# Patient Record
Sex: Female | Born: 1935 | Race: White | Hispanic: No | Marital: Married | Smoking: Never smoker
Health system: Southern US, Community
[De-identification: ages and names within clinical notes are randomized; demographics above are authoritative.]

## PROBLEM LIST (undated history)

## (undated) DIAGNOSIS — J449 Chronic obstructive pulmonary disease, unspecified: Secondary | ICD-10-CM

## (undated) DIAGNOSIS — S7222XA Displaced subtrochanteric fracture of left femur, initial encounter for closed fracture: Secondary | ICD-10-CM

## (undated) DIAGNOSIS — I099 Rheumatic heart disease, unspecified: Secondary | ICD-10-CM

## (undated) DIAGNOSIS — E119 Type 2 diabetes mellitus without complications: Secondary | ICD-10-CM

## (undated) DIAGNOSIS — D494 Neoplasm of unspecified behavior of bladder: Secondary | ICD-10-CM

## (undated) DIAGNOSIS — I429 Cardiomyopathy, unspecified: Secondary | ICD-10-CM

## (undated) DIAGNOSIS — E079 Disorder of thyroid, unspecified: Secondary | ICD-10-CM

## (undated) DIAGNOSIS — J90 Pleural effusion, not elsewhere classified: Secondary | ICD-10-CM

## (undated) DIAGNOSIS — I4891 Unspecified atrial fibrillation: Secondary | ICD-10-CM

## (undated) DIAGNOSIS — I509 Heart failure, unspecified: Secondary | ICD-10-CM

## (undated) DIAGNOSIS — J9610 Chronic respiratory failure, unspecified whether with hypoxia or hypercapnia: Secondary | ICD-10-CM

## (undated) HISTORY — DX: Neoplasm of unspecified behavior of bladder: D49.4

## (undated) HISTORY — PX: BLADDER TUMOR EXCISION: SHX238

## (undated) HISTORY — DX: Chronic obstructive pulmonary disease, unspecified: J44.9

## (undated) HISTORY — PX: MITRAL VALVE REPLACEMENT: SHX147

## (undated) HISTORY — DX: Disorder of thyroid, unspecified: E07.9

## (undated) HISTORY — PX: PACEMAKER INSERTION: SHX728

## (undated) HISTORY — DX: Cardiomyopathy, unspecified: I42.9

## (undated) HISTORY — DX: Unspecified atrial fibrillation: I48.91

## (undated) HISTORY — PX: OTHER SURGICAL HISTORY: SHX169

## (undated) HISTORY — DX: Chronic respiratory failure, unspecified whether with hypoxia or hypercapnia: J96.10

## (undated) HISTORY — DX: Displaced subtrochanteric fracture of left femur, initial encounter for closed fracture: S72.22XA

## (undated) HISTORY — DX: Pleural effusion, not elsewhere classified: J90

## (undated) HISTORY — DX: Type 2 diabetes mellitus without complications: E11.9

## (undated) HISTORY — DX: Heart failure, unspecified: I50.9

## (undated) HISTORY — DX: Rheumatic heart disease, unspecified: I09.9

---

## 2004-01-31 ENCOUNTER — Emergency Department: Payer: Self-pay | Admitting: Emergency Medicine

## 2004-01-31 ENCOUNTER — Other Ambulatory Visit: Payer: Self-pay

## 2004-03-12 ENCOUNTER — Ambulatory Visit: Payer: Self-pay | Admitting: Internal Medicine

## 2004-06-09 ENCOUNTER — Ambulatory Visit: Payer: Self-pay | Admitting: Gastroenterology

## 2004-10-28 ENCOUNTER — Ambulatory Visit: Payer: Self-pay | Admitting: Cardiology

## 2005-02-03 ENCOUNTER — Other Ambulatory Visit: Payer: Self-pay

## 2005-02-03 ENCOUNTER — Inpatient Hospital Stay: Payer: Self-pay | Admitting: Internal Medicine

## 2005-02-04 ENCOUNTER — Other Ambulatory Visit: Payer: Self-pay

## 2005-05-04 ENCOUNTER — Ambulatory Visit: Payer: Self-pay | Admitting: Internal Medicine

## 2005-05-27 ENCOUNTER — Other Ambulatory Visit: Payer: Self-pay

## 2005-05-28 ENCOUNTER — Inpatient Hospital Stay: Payer: Self-pay | Admitting: Internal Medicine

## 2005-05-31 ENCOUNTER — Other Ambulatory Visit: Payer: Self-pay

## 2005-06-01 ENCOUNTER — Other Ambulatory Visit: Payer: Self-pay

## 2005-07-05 ENCOUNTER — Other Ambulatory Visit: Payer: Self-pay

## 2005-07-05 ENCOUNTER — Emergency Department: Payer: Self-pay | Admitting: General Practice

## 2005-12-23 ENCOUNTER — Ambulatory Visit: Payer: Self-pay | Admitting: Nurse Practitioner

## 2006-05-18 ENCOUNTER — Ambulatory Visit: Payer: Self-pay | Admitting: Internal Medicine

## 2006-06-04 IMAGING — CR DG CHEST 2V
1 series · 2 of 2 positions shown · non-contrast
Comparison: none

REASON FOR EXAM: ppm
COMMENTS:

[Series 1: view not recorded · 0.17mm/px · 2 of 2 slices shown]
[im 1/2]
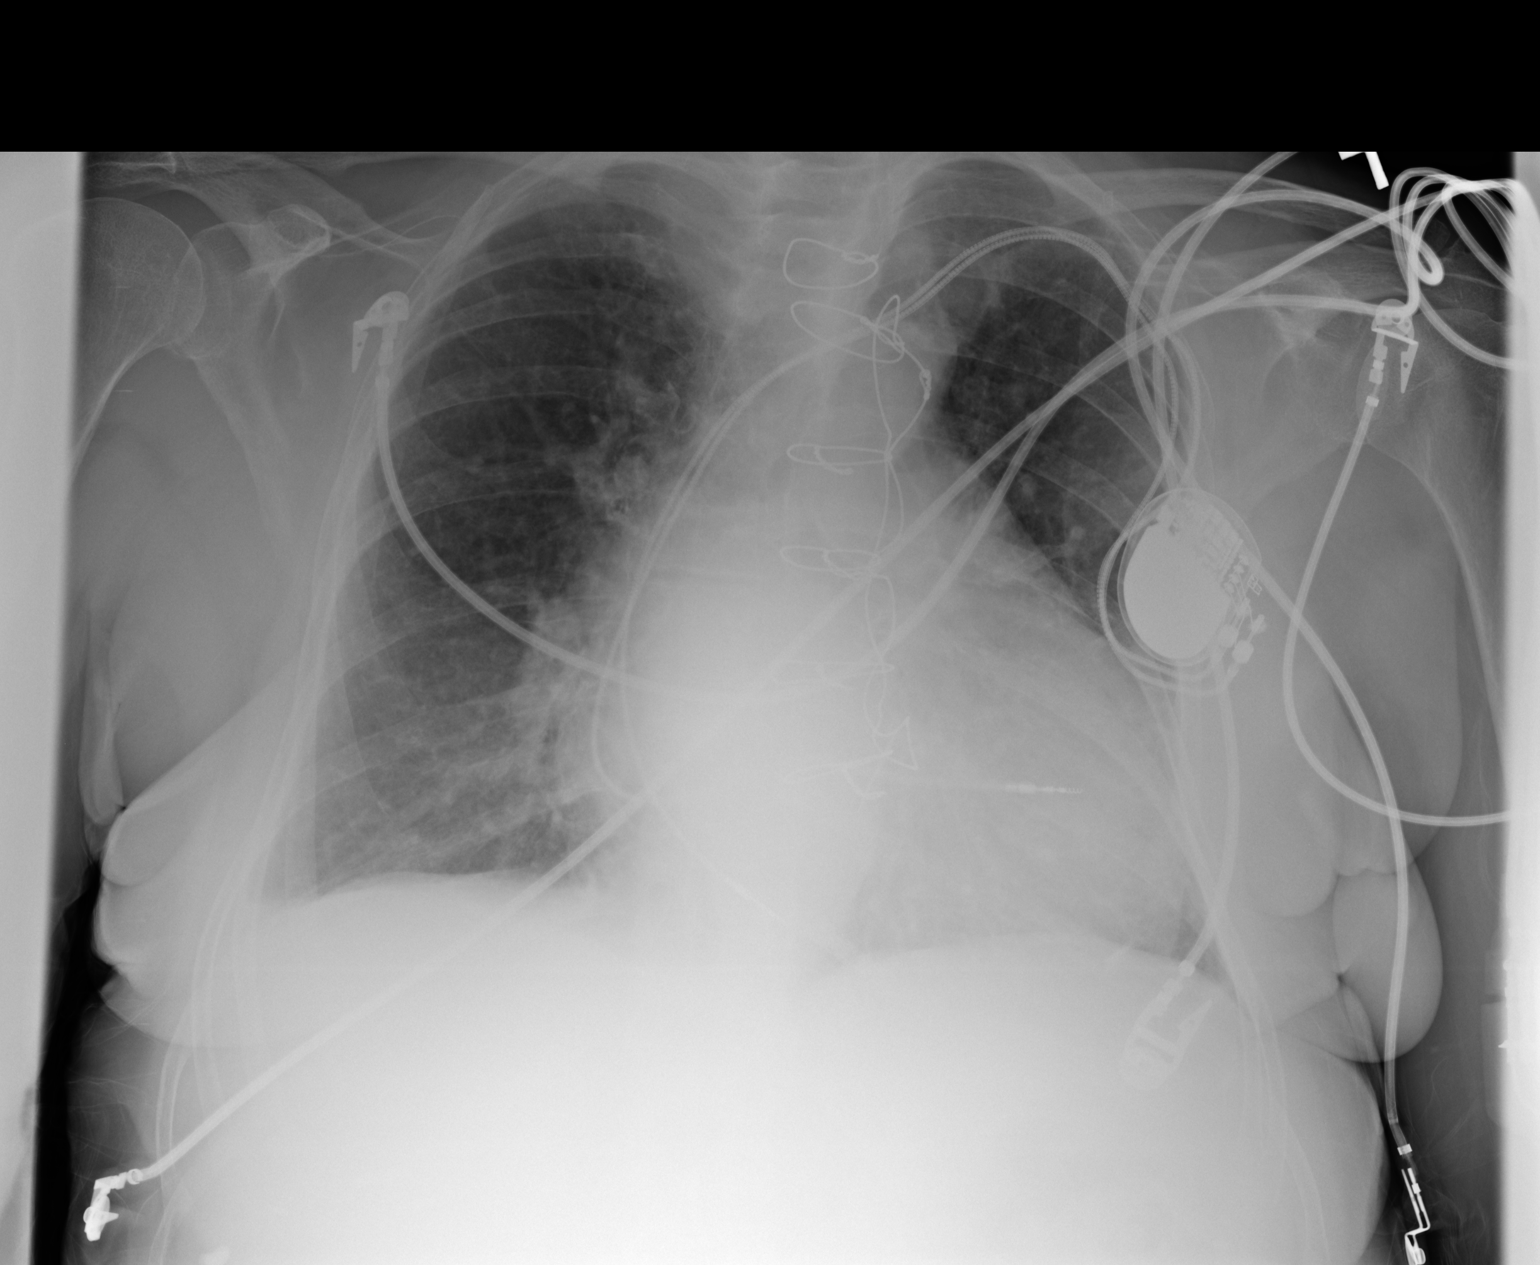
[im 2/2]
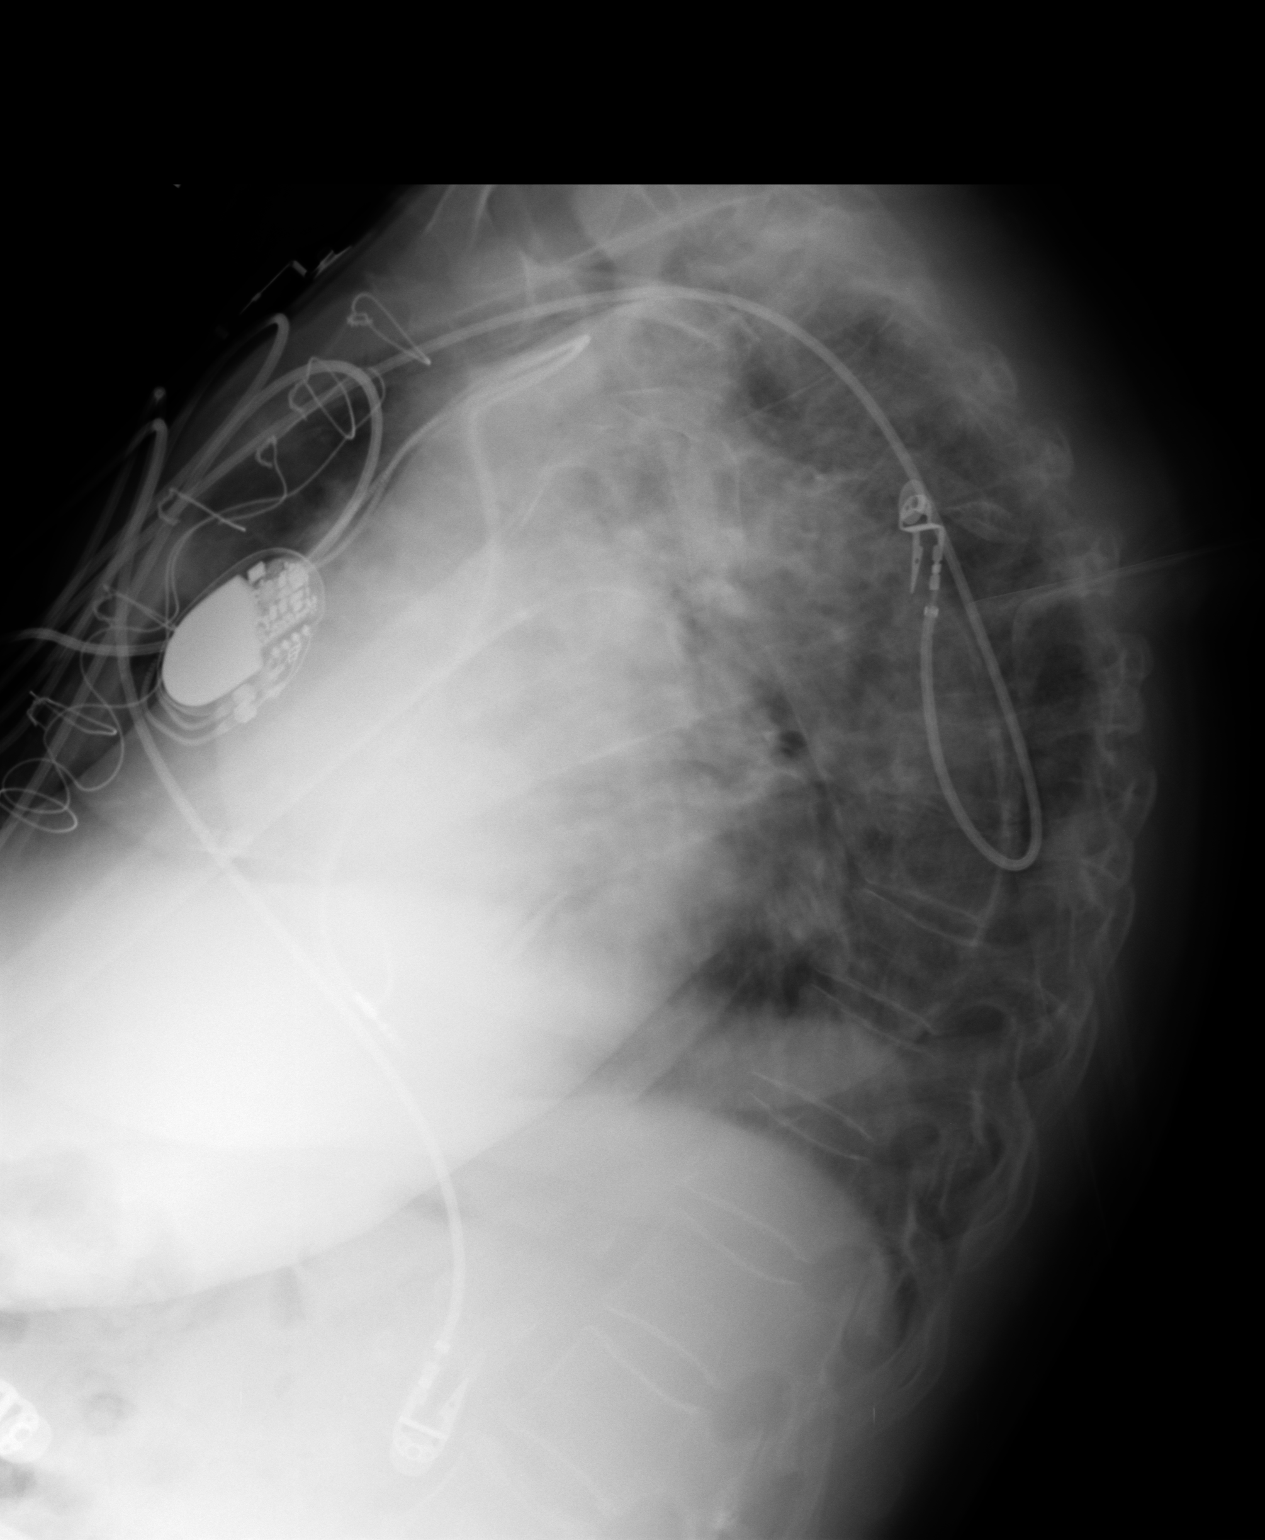

[2 of 2 positions shown; findings below may reference images not displayed]

PROCEDURE:     DXR - DXR CHEST PA (OR AP) AND LATERAL  - May 31, 2005 [DATE]

RESULT:     Comparison is made to study 28 May, 2005.

The cardiopericardial silhouette is enlarged.  The patient has undergone
prior CABG.  The pulmonary vascularity is mildly prominent centrally. There
is a permanent pacemaker in place. I see no evidence of a pleural effusion.
Mildly increased perihilar lung markings are noted.
IMPRESSION: 1)There are findings, which may reflect an element of low-grade compensated
CHF.  I do not see evidence of pneumonia.

## 2006-06-05 IMAGING — CR DG CHEST 2V
1 series · 2 of 2 positions shown · non-contrast
Comparison: none

REASON FOR EXAM: Pacemaker
COMMENTS:

[Series 3687: postero_anterior · 0.22mm/px · 2 of 2 slices shown]
[im 1/2]
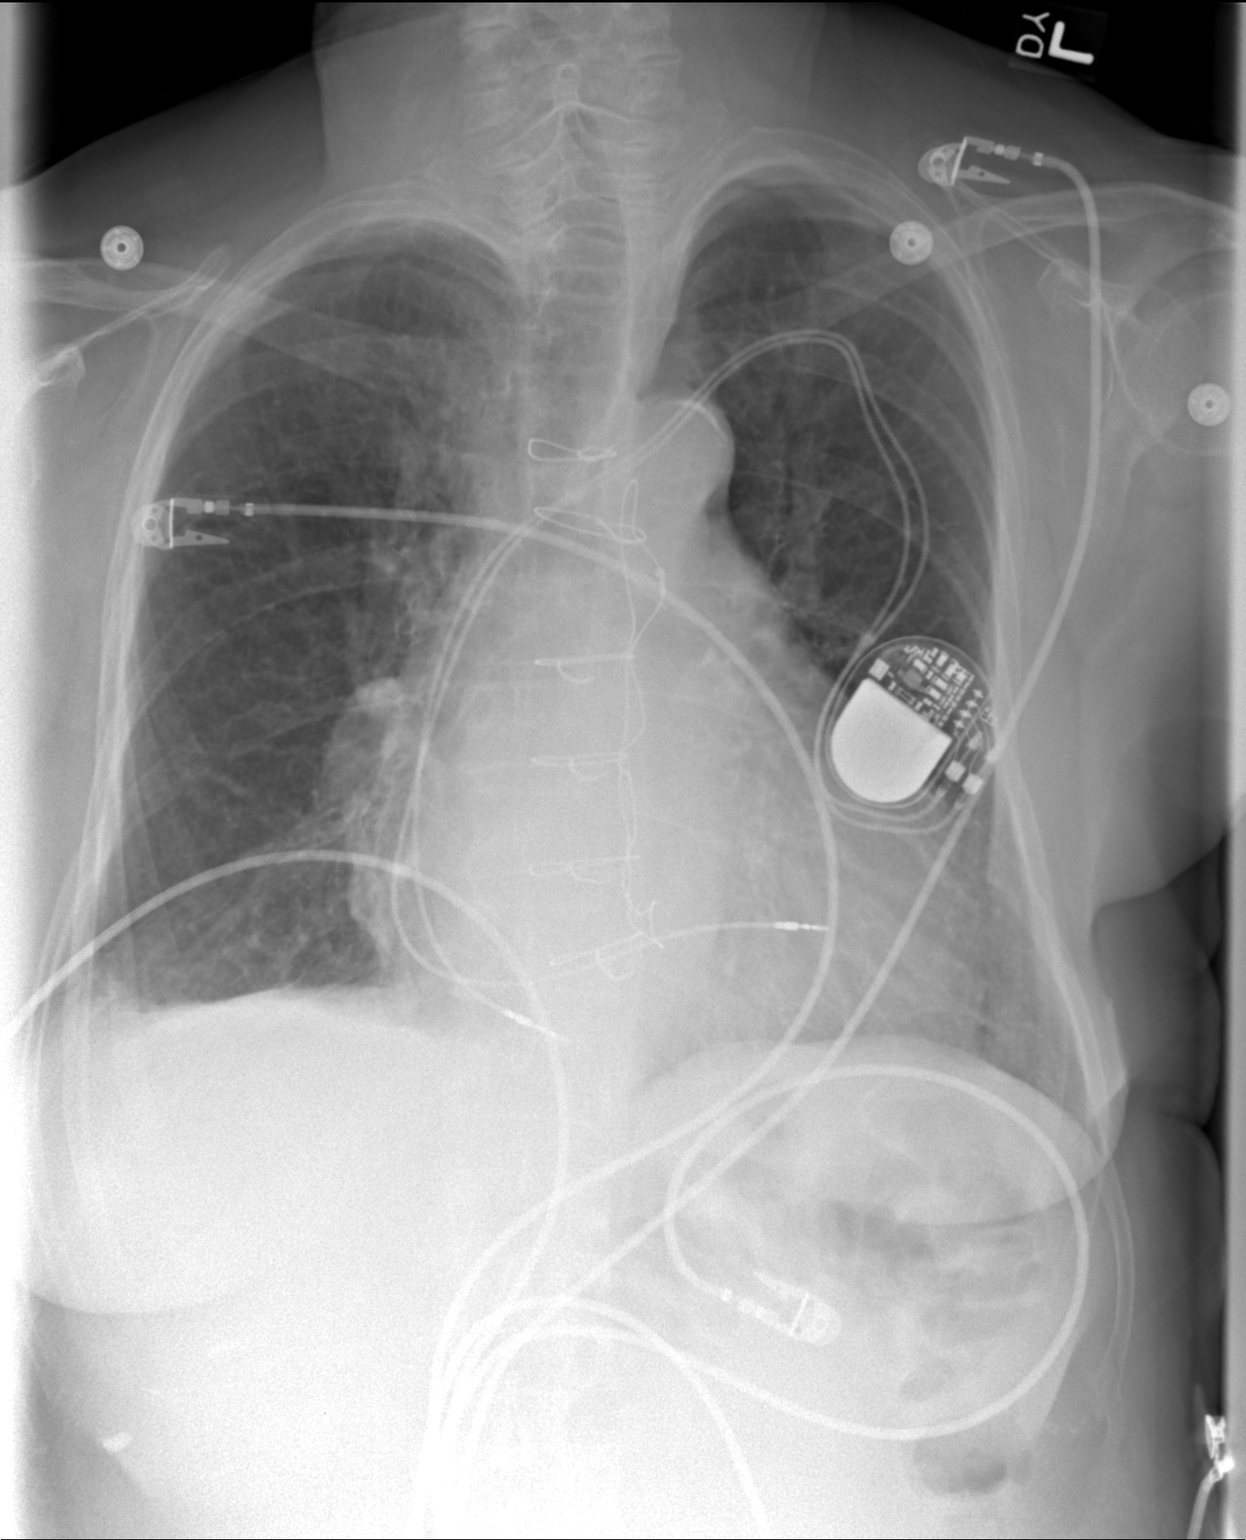
[im 2/2]
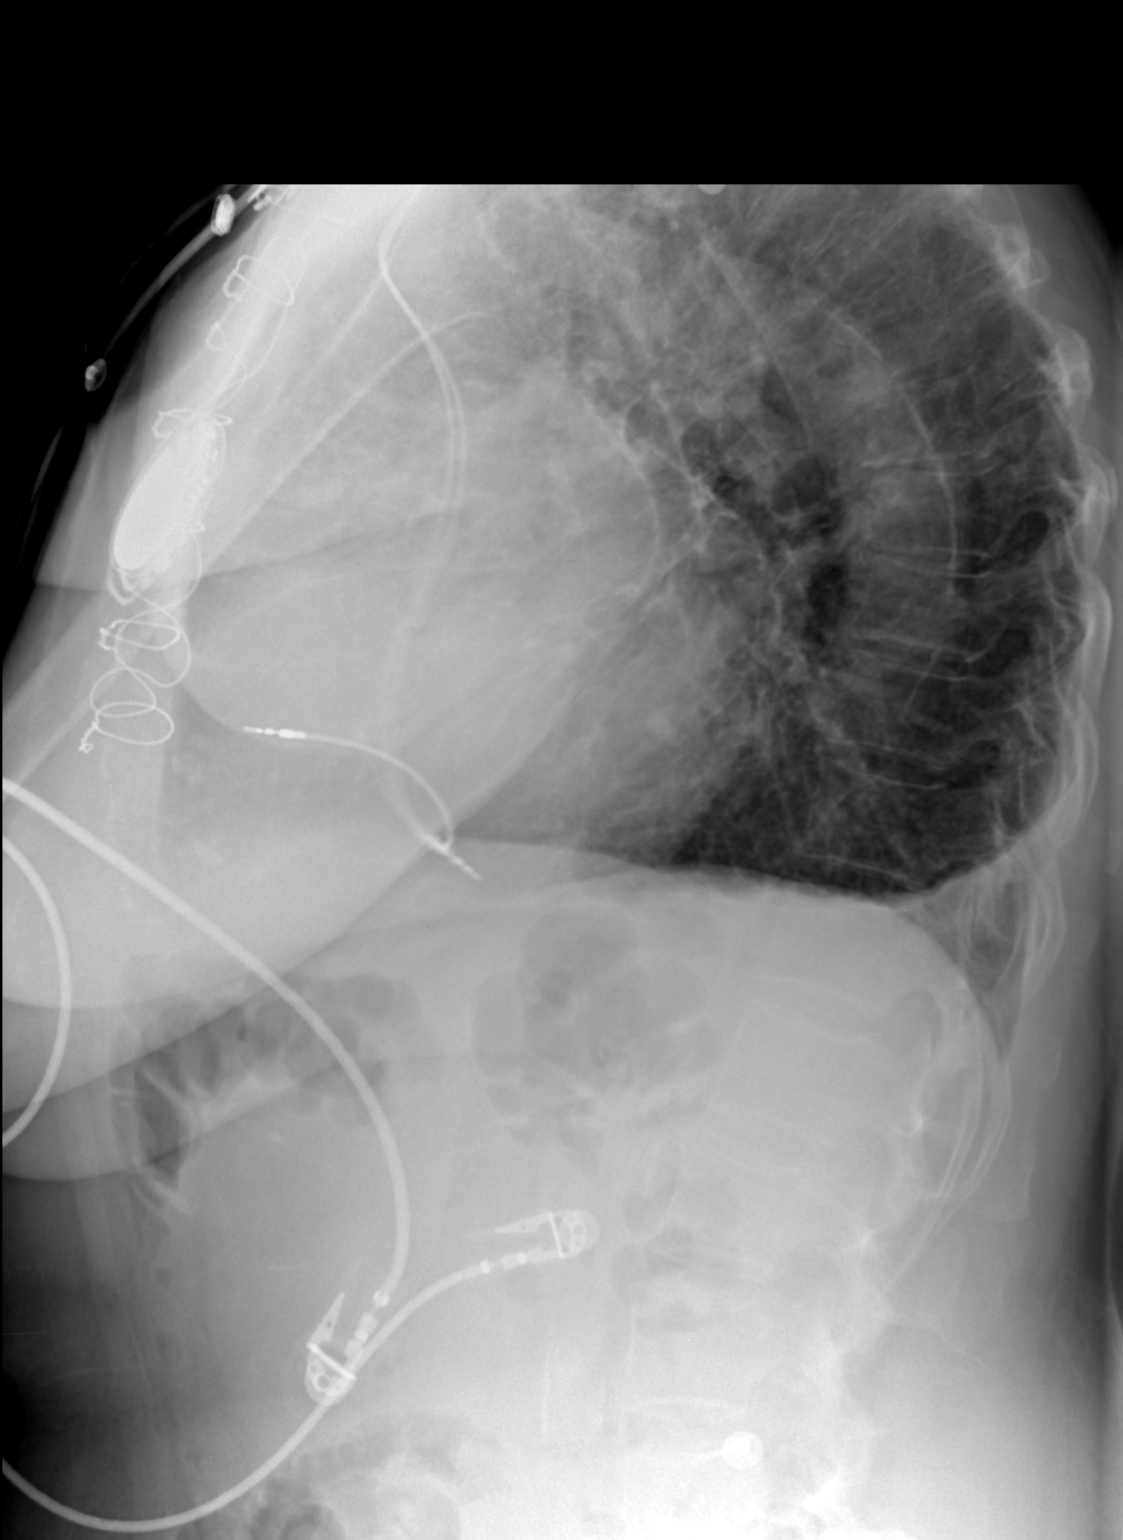

[2 of 2 positions shown; findings below may reference images not displayed]

PROCEDURE:     DXR - DXR CHEST PA (OR AP) AND LATERAL  - June 01, 2005  [DATE]

RESULT:     Two views of the chest are compared to the prior two-view study
of 05-31-05.  The heart is enlarged. A LEFT sided pacemaker device is present.
Cardiac monitoring electrodes are in place. There is no evidence of
infiltrate. There is no significant effusion. The interstitial markings are
mildly prominent as on the prior study.  Whether this represents an acute
mild pulmonary edema or chronic cardiac decompensation is uncertain. There
is an appearance suggestive of possibly some minimal pericardial
calcification seen on the lateral view. There is prominence of the pulmonary
vasculature centrally, which is unchanged. Trace RIGHT pleural effusion does
appear to be present with blunting of the RIGHT costophrenic angle.
IMPRESSION: 1)Essentially stable chest x-ray with continued cardiomegaly.

2)There may be some mild interstitial edema.

3)Trace RIGHT pleural effusion.

## 2006-07-09 IMAGING — CR DG CHEST 1V PORT
1 series · 1 of 1 positions shown · non-contrast
Comparison: none

REASON FOR EXAM: chest pain rm 4
COMMENTS:

[view not recorded]
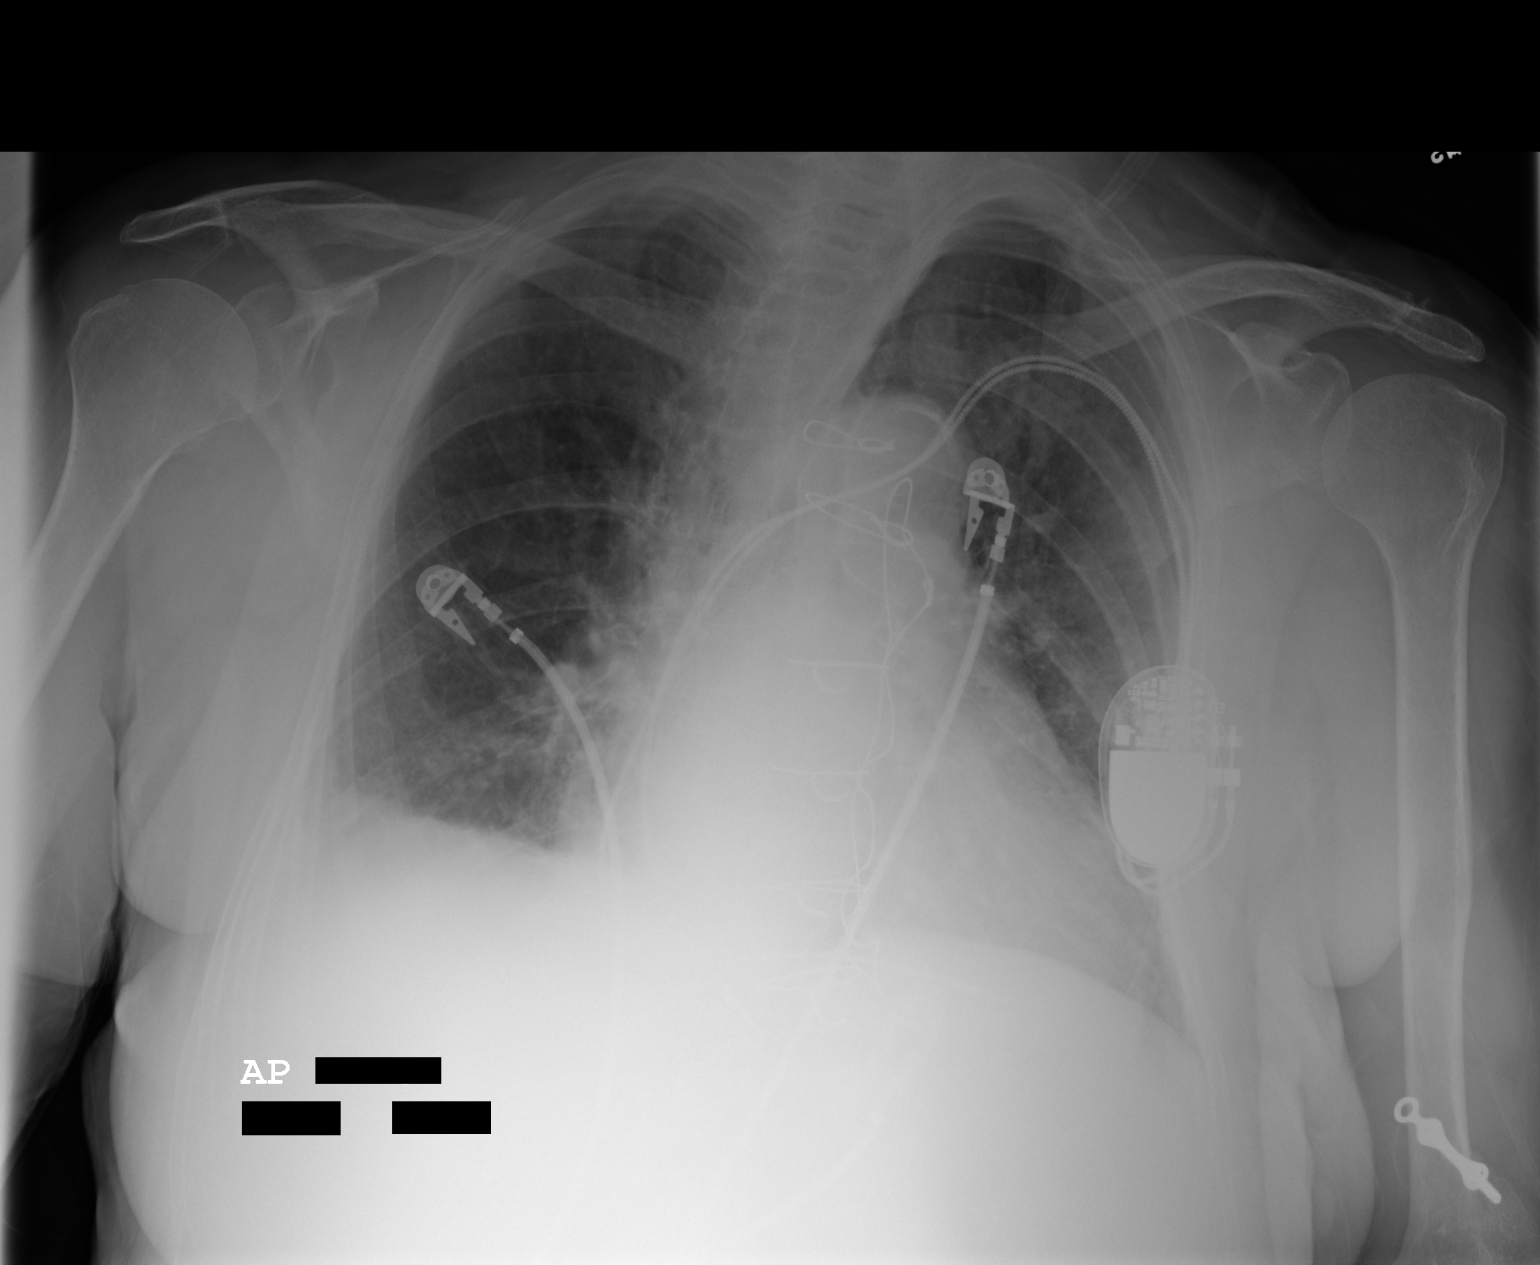

[1 of 1 positions shown; findings below may reference images not displayed]

PROCEDURE:     DXR - DXR PORTABLE CHEST SINGLE VIEW  - July 05, 2005  [DATE]

RESULT:     Cardiomegaly is present.  The patient has had a prior CABG.
Cardiac pacer is noted.  Mild blunting of the RIGHT costophrenic angle and
mild basilar interstitial prominence is noted. Very mild congestive heart
failure cannot be excluded.  Similar finding is noted on 06-01-05.
IMPRESSION: 1)Cannot exclude very mild congestive heart failure with mild basilar
interstitial prominence and small RIGHT pleural effusion.

2)The patient has had a prior CABG and a cardiac pacer is present. Chest is
stable from 06-01-05.

## 2007-06-29 ENCOUNTER — Ambulatory Visit: Payer: Self-pay | Admitting: Internal Medicine

## 2008-07-02 ENCOUNTER — Ambulatory Visit: Payer: Self-pay | Admitting: Internal Medicine

## 2008-07-08 ENCOUNTER — Ambulatory Visit: Payer: Self-pay | Admitting: Internal Medicine

## 2009-07-03 ENCOUNTER — Ambulatory Visit: Payer: Self-pay | Admitting: Internal Medicine

## 2009-08-13 ENCOUNTER — Ambulatory Visit: Payer: Self-pay | Admitting: Gastroenterology

## 2010-05-20 ENCOUNTER — Inpatient Hospital Stay: Payer: Self-pay | Admitting: Internal Medicine

## 2010-07-07 ENCOUNTER — Ambulatory Visit: Payer: Self-pay | Admitting: Internal Medicine

## 2010-08-19 ENCOUNTER — Ambulatory Visit: Payer: Self-pay | Admitting: Specialist

## 2010-08-26 ENCOUNTER — Ambulatory Visit: Payer: Self-pay | Admitting: Specialist

## 2011-06-11 ENCOUNTER — Inpatient Hospital Stay: Payer: Self-pay | Admitting: Internal Medicine

## 2011-06-11 LAB — URINALYSIS, COMPLETE
Bilirubin,UR: NEGATIVE
Glucose,UR: NEGATIVE mg/dL (ref 0–75)
Ketone: NEGATIVE
Ph: 5 (ref 4.5–8.0)

## 2011-06-11 LAB — COMPREHENSIVE METABOLIC PANEL
Albumin: 3.7 g/dL (ref 3.4–5.0)
Alkaline Phosphatase: 152 U/L — ABNORMAL HIGH (ref 50–136)
Anion Gap: 12 (ref 7–16)
Bilirubin,Total: 1.3 mg/dL — ABNORMAL HIGH (ref 0.2–1.0)
Calcium, Total: 8.5 mg/dL (ref 8.5–10.1)
Chloride: 89 mmol/L — ABNORMAL LOW (ref 98–107)
Co2: 24 mmol/L (ref 21–32)
EGFR (African American): >60
EGFR (Non-African Amer.): >60
Osmolality: 263 (ref 275–301)
Potassium: 5.5 mmol/L — ABNORMAL HIGH (ref 3.5–5.1)
SGPT (ALT): 23 U/L

## 2011-06-11 LAB — TSH: Thyroid Stimulating Horm: 3.58 u[IU]/mL

## 2011-06-11 LAB — CBC
HCT: 37.8 % (ref 35.0–47.0)
HGB: 12.6 g/dL (ref 12.0–16.0)
MCV: 92 fL (ref 80–100)
Platelet: 198 10*3/uL (ref 150–440)
RDW: 13.4 % (ref 11.5–14.5)
WBC: 18 10*3/uL — ABNORMAL HIGH (ref 3.6–11.0)

## 2011-06-11 LAB — CK TOTAL AND CKMB (NOT AT ARMC): CK, Total: 129 U/L (ref 21–215)

## 2011-06-11 LAB — DIGOXIN LEVEL: Digoxin: 1.69 ng/mL

## 2011-06-11 LAB — RAPID INFLUENZA A&B ANTIGENS

## 2011-06-12 LAB — CBC WITH DIFFERENTIAL/PLATELET
Basophil #: 0 10*3/uL (ref 0.0–0.1)
Eosinophil #: 0 10*3/uL (ref 0.0–0.7)
HCT: 34.8 % — ABNORMAL LOW (ref 35.0–47.0)
HGB: 11.6 g/dL — ABNORMAL LOW (ref 12.0–16.0)
Lymphocyte #: 0.8 10*3/uL — ABNORMAL LOW (ref 1.0–3.6)
Lymphocyte %: 5.6 %
MCHC: 33.4 g/dL (ref 32.0–36.0)
Monocyte %: 9.7 %
Platelet: 173 10*3/uL (ref 150–440)
RDW: 13.1 % (ref 11.5–14.5)

## 2011-06-12 LAB — TROPONIN I
Troponin-I: <0.02 ng/mL
Troponin-I: <0.02 ng/mL

## 2011-06-12 LAB — BASIC METABOLIC PANEL
Anion Gap: 10 (ref 7–16)
BUN: 2 mg/dL — ABNORMAL LOW (ref 7–18)
Chloride: 88 mmol/L — ABNORMAL LOW (ref 98–107)
Co2: 29 mmol/L (ref 21–32)
Creatinine: 0.54 mg/dL — ABNORMAL LOW (ref 0.60–1.30)
EGFR (Non-African Amer.): >60
Potassium: 3.6 mmol/L (ref 3.5–5.1)
Sodium: 127 mmol/L — ABNORMAL LOW (ref 136–145)

## 2011-06-12 LAB — CK TOTAL AND CKMB (NOT AT ARMC): CK-MB: 1.6 ng/mL (ref 0.5–3.6)

## 2011-06-12 LAB — MAGNESIUM: Magnesium: 1.7 mg/dL — ABNORMAL LOW

## 2011-06-13 LAB — BASIC METABOLIC PANEL
Anion Gap: 8 (ref 7–16)
BUN: 18 mg/dL (ref 7–18)
Calcium, Total: 8.7 mg/dL (ref 8.5–10.1)
Co2: 29 mmol/L (ref 21–32)
EGFR (African American): >60
EGFR (Non-African Amer.): >60
Glucose: 203 mg/dL — ABNORMAL HIGH (ref 65–99)
Osmolality: 263 (ref 275–301)
Potassium: 4 mmol/L (ref 3.5–5.1)
Sodium: 127 mmol/L — ABNORMAL LOW (ref 136–145)

## 2011-06-13 LAB — CBC WITH DIFFERENTIAL/PLATELET
Basophil %: 0.1 %
Eosinophil #: 0 10*3/uL (ref 0.0–0.7)
HCT: 35.6 % (ref 35.0–47.0)
HGB: 11.8 g/dL — ABNORMAL LOW (ref 12.0–16.0)
Lymphocyte #: 0.5 10*3/uL — ABNORMAL LOW (ref 1.0–3.6)
Lymphocyte %: 4.4 %
MCH: 30.3 pg (ref 26.0–34.0)
MCHC: 33 g/dL (ref 32.0–36.0)
MCV: 92 fL (ref 80–100)
Monocyte #: 0.9 10*3/uL — ABNORMAL HIGH (ref 0.0–0.7)
Neutrophil #: 10.1 10*3/uL — ABNORMAL HIGH (ref 1.4–6.5)
Neutrophil %: 87.9 %
RBC: 3.88 10*6/uL (ref 3.80–5.20)
RDW: 13.2 % (ref 11.5–14.5)
WBC: 11.5 10*3/uL — ABNORMAL HIGH (ref 3.6–11.0)

## 2011-06-13 LAB — MAGNESIUM: Magnesium: 1.8 mg/dL

## 2011-06-14 LAB — CBC WITH DIFFERENTIAL/PLATELET
Basophil #: 0 10*3/uL (ref 0.0–0.1)
Eosinophil #: 0 10*3/uL (ref 0.0–0.7)
Eosinophil %: 0 %
Lymphocyte %: 4.7 %
MCH: 30.8 pg (ref 26.0–34.0)
MCHC: 33.5 g/dL (ref 32.0–36.0)
Monocyte #: 1.1 10*3/uL — ABNORMAL HIGH (ref 0.0–0.7)
Neutrophil %: 88.5 %
Platelet: 251 10*3/uL (ref 150–440)
RDW: 13.3 % (ref 11.5–14.5)

## 2011-06-14 LAB — URINE CULTURE

## 2011-06-14 LAB — BASIC METABOLIC PANEL
BUN: 19 mg/dL — ABNORMAL HIGH (ref 7–18)
Calcium, Total: 8.7 mg/dL (ref 8.5–10.1)
Co2: 30 mmol/L (ref 21–32)
Creatinine: 0.53 mg/dL — ABNORMAL LOW (ref 0.60–1.30)
EGFR (African American): >60
EGFR (Non-African Amer.): >60
Glucose: 197 mg/dL — ABNORMAL HIGH (ref 65–99)
Osmolality: 274 (ref 275–301)
Potassium: 4.5 mmol/L (ref 3.5–5.1)
Sodium: 133 mmol/L — ABNORMAL LOW (ref 136–145)

## 2011-06-14 LAB — PROTIME-INR: Prothrombin Time: 26.4 secs — ABNORMAL HIGH (ref 11.5–14.7)

## 2011-06-15 LAB — BASIC METABOLIC PANEL
Anion Gap: 11 (ref 7–16)
Calcium, Total: 8.6 mg/dL (ref 8.5–10.1)
Co2: 31 mmol/L (ref 21–32)
EGFR (African American): >60
EGFR (Non-African Amer.): >60
Glucose: 264 mg/dL — ABNORMAL HIGH (ref 65–99)
Osmolality: 280 (ref 275–301)
Sodium: 133 mmol/L — ABNORMAL LOW (ref 136–145)

## 2011-06-15 LAB — HEMOGLOBIN: HGB: 12.3 g/dL (ref 12.0–16.0)

## 2011-06-16 LAB — PROTIME-INR: Prothrombin Time: 30 secs — ABNORMAL HIGH (ref 11.5–14.7)

## 2011-07-27 ENCOUNTER — Ambulatory Visit: Payer: Self-pay | Admitting: Internal Medicine

## 2012-07-27 ENCOUNTER — Ambulatory Visit: Payer: Self-pay

## 2012-10-19 ENCOUNTER — Ambulatory Visit: Payer: Self-pay | Admitting: Cardiology

## 2012-10-19 LAB — BASIC METABOLIC PANEL
Anion Gap: 5 — ABNORMAL LOW (ref 7–16)
BUN: 9 mg/dL (ref 7–18)
Calcium, Total: 9.2 mg/dL (ref 8.5–10.1)
Chloride: 97 mmol/L — ABNORMAL LOW (ref 98–107)
Co2: 33 mmol/L — ABNORMAL HIGH (ref 21–32)
Creatinine: 0.71 mg/dL (ref 0.60–1.30)
Glucose: 184 mg/dL — ABNORMAL HIGH (ref 65–99)
Osmolality: 274 (ref 275–301)
Potassium: 4.5 mmol/L (ref 3.5–5.1)
Sodium: 135 mmol/L — ABNORMAL LOW (ref 136–145)

## 2012-10-19 LAB — CBC WITH DIFFERENTIAL/PLATELET
Basophil #: 0.1 10*3/uL (ref 0.0–0.1)
HCT: 38.6 % (ref 35.0–47.0)
HGB: 12.9 g/dL (ref 12.0–16.0)
Lymphocyte %: 13.9 %
MCV: 87 fL (ref 80–100)
Monocyte #: 1 x10 3/mm — ABNORMAL HIGH (ref 0.2–0.9)
Monocyte %: 13.4 %
Neutrophil #: 5.1 10*3/uL (ref 1.4–6.5)
Neutrophil %: 68.7 %
RDW: 14.2 % (ref 11.5–14.5)
WBC: 7.5 10*3/uL (ref 3.6–11.0)

## 2012-10-19 LAB — PROTIME-INR
INR: 1.4
Prothrombin Time: 17.1 secs — ABNORMAL HIGH (ref 11.5–14.7)

## 2012-10-26 ENCOUNTER — Ambulatory Visit: Payer: Self-pay | Admitting: Cardiology

## 2012-10-26 LAB — PROTIME-INR: INR: 1.1

## 2013-02-22 ENCOUNTER — Observation Stay: Payer: Self-pay

## 2013-02-22 LAB — URINALYSIS, COMPLETE
Bilirubin,UR: NEGATIVE
Ketone: NEGATIVE
Nitrite: NEGATIVE
Protein: 30
WBC UR: 6 /HPF (ref 0–5)

## 2013-02-22 LAB — LIPASE, BLOOD: Lipase: 152 U/L (ref 73–393)

## 2013-02-22 LAB — COMPREHENSIVE METABOLIC PANEL
Anion Gap: 6 — ABNORMAL LOW (ref 7–16)
Calcium, Total: 9.3 mg/dL (ref 8.5–10.1)
Co2: 30 mmol/L (ref 21–32)
Creatinine: 0.79 mg/dL (ref 0.60–1.30)
EGFR (African American): >60
EGFR (Non-African Amer.): >60
Potassium: 3.9 mmol/L (ref 3.5–5.1)
SGPT (ALT): 26 U/L (ref 12–78)
Total Protein: 8 g/dL (ref 6.4–8.2)

## 2013-02-22 LAB — CBC WITH DIFFERENTIAL/PLATELET
Basophil #: 0.1 10*3/uL (ref 0.0–0.1)
Basophil %: 1 %
Eosinophil #: 0.3 10*3/uL (ref 0.0–0.7)
Eosinophil %: 4 %
HCT: 36.4 % (ref 35.0–47.0)
HGB: 12.4 g/dL (ref 12.0–16.0)
Lymphocyte %: 11.4 %
MCHC: 34.1 g/dL (ref 32.0–36.0)
MCV: 88 fL (ref 80–100)
Monocyte %: 12.6 %
Neutrophil #: 6 10*3/uL (ref 1.4–6.5)
Neutrophil %: 71 %
RDW: 14.7 % — ABNORMAL HIGH (ref 11.5–14.5)

## 2013-02-22 LAB — TROPONIN I: Troponin-I: <0.02 ng/mL

## 2013-02-22 LAB — PROTIME-INR
INR: 1.9
Prothrombin Time: 21.4 secs — ABNORMAL HIGH (ref 11.5–14.7)

## 2013-02-22 LAB — PRO B NATRIURETIC PEPTIDE: B-Type Natriuretic Peptide: 1718 pg/mL — ABNORMAL HIGH (ref 0–450)

## 2013-02-23 LAB — BASIC METABOLIC PANEL
Anion Gap: 2 — ABNORMAL LOW (ref 7–16)
Calcium, Total: 9.1 mg/dL (ref 8.5–10.1)
Creatinine: 0.78 mg/dL (ref 0.60–1.30)
EGFR (Non-African Amer.): >60
Osmolality: 269 (ref 275–301)
Potassium: 3.9 mmol/L (ref 3.5–5.1)
Sodium: 134 mmol/L — ABNORMAL LOW (ref 136–145)

## 2013-02-23 LAB — PROTIME-INR
INR: 1.9
Prothrombin Time: 21.9 secs — ABNORMAL HIGH (ref 11.5–14.7)

## 2013-03-02 ENCOUNTER — Ambulatory Visit: Payer: Self-pay

## 2013-05-25 ENCOUNTER — Inpatient Hospital Stay: Payer: Self-pay | Admitting: Internal Medicine

## 2013-05-25 LAB — BASIC METABOLIC PANEL
Anion Gap: 4 — ABNORMAL LOW (ref 7–16)
BUN: 17 mg/dL (ref 7–18)
CHLORIDE: 98 mmol/L (ref 98–107)
CREATININE: 0.77 mg/dL (ref 0.60–1.30)
Calcium, Total: 8.7 mg/dL (ref 8.5–10.1)
Co2: 29 mmol/L (ref 21–32)
EGFR (African American): >60
EGFR (Non-African Amer.): >60
GLUCOSE: 101 mg/dL — AB (ref 65–99)
Osmolality: 264 (ref 275–301)
POTASSIUM: 4.5 mmol/L (ref 3.5–5.1)
SODIUM: 131 mmol/L — AB (ref 136–145)

## 2013-05-25 LAB — URINALYSIS, COMPLETE
BILIRUBIN, UR: NEGATIVE
Glucose,UR: NEGATIVE mg/dL (ref 0–75)
Hyaline Cast: 1
KETONE: NEGATIVE
Leukocyte Esterase: NEGATIVE
Nitrite: POSITIVE
PH: 5 (ref 4.5–8.0)
PROTEIN: NEGATIVE
RBC,UR: 3 /HPF (ref 0–5)
Specific Gravity: 1.011 (ref 1.003–1.030)
Squamous Epithelial: 1

## 2013-05-25 LAB — CBC
HCT: 38.4 % (ref 35.0–47.0)
HGB: 12.5 g/dL (ref 12.0–16.0)
MCH: 28.9 pg (ref 26.0–34.0)
MCHC: 32.4 g/dL (ref 32.0–36.0)
MCV: 89 fL (ref 80–100)
Platelet: 135 10*3/uL — ABNORMAL LOW (ref 150–440)
RBC: 4.32 10*6/uL (ref 3.80–5.20)
RDW: 14 % (ref 11.5–14.5)
WBC: 5.4 10*3/uL (ref 3.6–11.0)

## 2013-05-25 LAB — PROTIME-INR
INR: 4.5
Prothrombin Time: 41.3 secs — ABNORMAL HIGH (ref 11.5–14.7)

## 2013-05-25 LAB — TROPONIN I: Troponin-I: <0.02 ng/mL

## 2013-05-25 LAB — DIGOXIN LEVEL: Digoxin: <0.1 ng/mL — ABNORMAL LOW

## 2013-05-25 LAB — PRO B NATRIURETIC PEPTIDE: B-Type Natriuretic Peptide: 3717 pg/mL — ABNORMAL HIGH (ref 0–450)

## 2013-05-26 LAB — BASIC METABOLIC PANEL
Anion Gap: 3 — ABNORMAL LOW (ref 7–16)
BUN: 14 mg/dL (ref 7–18)
CALCIUM: 8.5 mg/dL (ref 8.5–10.1)
CHLORIDE: 95 mmol/L — AB (ref 98–107)
Co2: 34 mmol/L — ABNORMAL HIGH (ref 21–32)
Creatinine: 0.8 mg/dL (ref 0.60–1.30)
EGFR (African American): >60
Glucose: 84 mg/dL (ref 65–99)
Osmolality: 264 (ref 275–301)
POTASSIUM: 3.7 mmol/L (ref 3.5–5.1)
SODIUM: 132 mmol/L — AB (ref 136–145)

## 2013-05-26 LAB — CBC WITH DIFFERENTIAL/PLATELET
Basophil #: 0 10*3/uL (ref 0.0–0.1)
Basophil %: 0.7 %
Eosinophil #: 0.2 10*3/uL (ref 0.0–0.7)
Eosinophil %: 3.5 %
HCT: 38.8 % (ref 35.0–47.0)
HGB: 12.7 g/dL (ref 12.0–16.0)
LYMPHS ABS: 0.7 10*3/uL — AB (ref 1.0–3.6)
Lymphocyte %: 14.1 %
MCH: 29 pg (ref 26.0–34.0)
MCHC: 32.8 g/dL (ref 32.0–36.0)
MCV: 89 fL (ref 80–100)
Monocyte #: 0.7 x10 3/mm (ref 0.2–0.9)
Monocyte %: 13.9 %
Neutrophil #: 3.2 10*3/uL (ref 1.4–6.5)
Neutrophil %: 67.8 %
PLATELETS: 126 10*3/uL — AB (ref 150–440)
RBC: 4.38 10*6/uL (ref 3.80–5.20)
RDW: 13.9 % (ref 11.5–14.5)
WBC: 4.7 10*3/uL (ref 3.6–11.0)

## 2013-05-26 LAB — CK-MB
CK-MB: 1.9 ng/mL (ref 0.5–3.6)
CK-MB: 2 ng/mL (ref 0.5–3.6)
CK-MB: 1.8 ng/mL (ref 0.5–3.6)

## 2013-05-26 LAB — PRO B NATRIURETIC PEPTIDE: B-Type Natriuretic Peptide: 3331 pg/mL — ABNORMAL HIGH (ref 0–450)

## 2013-05-26 LAB — TROPONIN I
Troponin-I: <0.02 ng/mL
Troponin-I: <0.02 ng/mL

## 2013-05-27 LAB — CBC WITH DIFFERENTIAL/PLATELET
BASOS ABS: 0 10*3/uL (ref 0.0–0.1)
BASOS PCT: 0.7 %
EOS PCT: 2.6 %
Eosinophil #: 0.1 10*3/uL (ref 0.0–0.7)
HCT: 37 % (ref 35.0–47.0)
HGB: 12.2 g/dL (ref 12.0–16.0)
Lymphocyte #: 0.8 10*3/uL — ABNORMAL LOW (ref 1.0–3.6)
Lymphocyte %: 17.2 %
MCH: 29.1 pg (ref 26.0–34.0)
MCHC: 33 g/dL (ref 32.0–36.0)
MCV: 88 fL (ref 80–100)
MONOS PCT: 13.7 %
Monocyte #: 0.7 x10 3/mm (ref 0.2–0.9)
NEUTROS ABS: 3.2 10*3/uL (ref 1.4–6.5)
Neutrophil %: 65.8 %
Platelet: 136 10*3/uL — ABNORMAL LOW (ref 150–440)
RBC: 4.19 10*6/uL (ref 3.80–5.20)
RDW: 14 % (ref 11.5–14.5)
WBC: 4.8 10*3/uL (ref 3.6–11.0)

## 2013-05-27 LAB — BASIC METABOLIC PANEL
Anion Gap: 4 — ABNORMAL LOW (ref 7–16)
BUN: 11 mg/dL (ref 7–18)
CALCIUM: 8.9 mg/dL (ref 8.5–10.1)
CHLORIDE: 93 mmol/L — AB (ref 98–107)
Co2: 34 mmol/L — ABNORMAL HIGH (ref 21–32)
Creatinine: 0.69 mg/dL (ref 0.60–1.30)
GLUCOSE: 80 mg/dL (ref 65–99)
OSMOLALITY: 261 (ref 275–301)
POTASSIUM: 4.2 mmol/L (ref 3.5–5.1)
SODIUM: 131 mmol/L — AB (ref 136–145)

## 2013-05-27 LAB — PROTIME-INR
INR: 4.8 — AB
Prothrombin Time: 43.5 secs — ABNORMAL HIGH (ref 11.5–14.7)

## 2013-05-27 LAB — MAGNESIUM: Magnesium: 2 mg/dL

## 2013-05-28 LAB — BASIC METABOLIC PANEL
Anion Gap: 1 — ABNORMAL LOW (ref 7–16)
BUN: 10 mg/dL (ref 7–18)
CALCIUM: 9 mg/dL (ref 8.5–10.1)
CHLORIDE: 95 mmol/L — AB (ref 98–107)
CO2: 35 mmol/L — AB (ref 21–32)
Creatinine: 0.76 mg/dL (ref 0.60–1.30)
EGFR (African American): >60
Glucose: 114 mg/dL — ABNORMAL HIGH (ref 65–99)
OSMOLALITY: 263 (ref 275–301)
Potassium: 3.9 mmol/L (ref 3.5–5.1)
SODIUM: 131 mmol/L — AB (ref 136–145)

## 2013-05-28 LAB — PROTIME-INR
INR: 4
Prothrombin Time: 37.9 secs — ABNORMAL HIGH (ref 11.5–14.7)

## 2013-05-29 LAB — PROTIME-INR
INR: 2.6
Prothrombin Time: 26.9 secs — ABNORMAL HIGH (ref 11.5–14.7)

## 2013-05-29 LAB — BASIC METABOLIC PANEL
ANION GAP: 1 — AB (ref 7–16)
BUN: 11 mg/dL (ref 7–18)
CALCIUM: 8.7 mg/dL (ref 8.5–10.1)
CHLORIDE: 94 mmol/L — AB (ref 98–107)
CO2: 35 mmol/L — AB (ref 21–32)
Creatinine: 0.76 mg/dL (ref 0.60–1.30)
EGFR (African American): >60
Glucose: 109 mg/dL — ABNORMAL HIGH (ref 65–99)
OSMOLALITY: 261 (ref 275–301)
Potassium: 4 mmol/L (ref 3.5–5.1)
SODIUM: 130 mmol/L — AB (ref 136–145)

## 2013-05-30 LAB — BASIC METABOLIC PANEL
ANION GAP: 4 — AB (ref 7–16)
BUN: 12 mg/dL (ref 7–18)
CREATININE: 0.75 mg/dL (ref 0.60–1.30)
Calcium, Total: 9 mg/dL (ref 8.5–10.1)
Chloride: 92 mmol/L — ABNORMAL LOW (ref 98–107)
Co2: 35 mmol/L — ABNORMAL HIGH (ref 21–32)
EGFR (African American): >60
GLUCOSE: 112 mg/dL — AB (ref 65–99)
OSMOLALITY: 263 (ref 275–301)
POTASSIUM: 3.9 mmol/L (ref 3.5–5.1)
SODIUM: 131 mmol/L — AB (ref 136–145)

## 2013-05-30 LAB — PROTIME-INR
INR: 2.3
Prothrombin Time: 24.4 secs — ABNORMAL HIGH (ref 11.5–14.7)

## 2013-05-31 ENCOUNTER — Non-Acute Institutional Stay (SKILLED_NURSING_FACILITY): Payer: Medicare PPO | Admitting: Adult Health

## 2013-05-31 ENCOUNTER — Encounter: Payer: Self-pay | Admitting: Adult Health

## 2013-05-31 DIAGNOSIS — J9620 Acute and chronic respiratory failure, unspecified whether with hypoxia or hypercapnia: Secondary | ICD-10-CM | POA: Insufficient documentation

## 2013-05-31 DIAGNOSIS — K5900 Constipation, unspecified: Secondary | ICD-10-CM

## 2013-05-31 DIAGNOSIS — I4891 Unspecified atrial fibrillation: Secondary | ICD-10-CM

## 2013-05-31 DIAGNOSIS — I255 Ischemic cardiomyopathy: Secondary | ICD-10-CM

## 2013-05-31 DIAGNOSIS — I5043 Acute on chronic combined systolic (congestive) and diastolic (congestive) heart failure: Secondary | ICD-10-CM | POA: Insufficient documentation

## 2013-05-31 DIAGNOSIS — K219 Gastro-esophageal reflux disease without esophagitis: Secondary | ICD-10-CM | POA: Insufficient documentation

## 2013-05-31 DIAGNOSIS — Z950 Presence of cardiac pacemaker: Secondary | ICD-10-CM

## 2013-05-31 DIAGNOSIS — I2589 Other forms of chronic ischemic heart disease: Secondary | ICD-10-CM

## 2013-05-31 DIAGNOSIS — E039 Hypothyroidism, unspecified: Secondary | ICD-10-CM

## 2013-05-31 DIAGNOSIS — Z7901 Long term (current) use of anticoagulants: Secondary | ICD-10-CM

## 2013-05-31 DIAGNOSIS — E1159 Type 2 diabetes mellitus with other circulatory complications: Secondary | ICD-10-CM

## 2013-05-31 DIAGNOSIS — J449 Chronic obstructive pulmonary disease, unspecified: Secondary | ICD-10-CM

## 2013-05-31 NOTE — Progress Notes (Signed)
Patient ID: [name] Perkins, female   DOB: [birth year], 80 y.o.   MRN: [id number]     [facility]  Allergies  Allergen Reactions  . Contrast Media [Iodinated Diagnostic Agents]   . Erythromycin   . Parafon Forte Dsc [Chlorzoxazone]   . Penicillins   . Septra [Sulfamethoxazole-Tmp Ds]      Chief Complaint  Patient presents with  . Hospitalization Follow-up    HPI:  She has been hospitalized for acute on chronic heart failure; respiratory failure and copd. She is here for short term rehab with her goal to return back home. There are no concerns being voiced by the nursing staff at this time.  Her inr today os 2.3 and is taking coumadin 3.5 mg daily; she is on coumadin for afib with a valve replacement her inr needs to be between 2.5-3.5    Past Medical History  Diagnosis Date  . CHF (congestive heart failure)   . Pleural effusion, right   . A-fib   . Thyroid disease   . Chronic rheumatic heart disease   . Diabetes mellitus without complication   . COPD (chronic obstructive pulmonary disease)   . Chronic respiratory failure   . Cardiomyopathy     ef 45%  . Closed left subtrochanteric femur fracture   . Bladder tumor     Past Surgical History  Procedure Laterality Date  . Pacemaker insertion    . Mitral valve replacement      St. Jude mechanical  . Left femur orif    . Bladder tumor excision      VITAL SIGNS BP 133/81  Pulse 81  Ht 5' (1.524 m)  Wt 129 lb (58.514 kg)  BMI 25.19 kg/m2   Patient's Medications  New Prescriptions   No medications on file  Previous Medications   CARVEDILOL (COREG) 6.25 MG TABLET    Take 6.25 mg by mouth 2 (two) times daily with a meal.   DOCUSATE SODIUM (COLACE) 100 MG CAPSULE    Take 100 mg by mouth 2 (two) times daily.   FUROSEMIDE (LASIX) 40 MG TABLET    Take 40 mg by mouth 2 (two) times daily.   GLIPIZIDE (GLUCOTROL XL) 10 MG 24 HR TABLET    Take 10 mg by mouth 2 (two) times daily with a meal.   LEVOTHYROXINE (SYNTHROID,  LEVOTHROID) 100 MCG TABLET    Take 100 mcg by mouth daily before breakfast.   METFORMIN (GLUCOPHAGE) 500 MG TABLET    Take 500 mg by mouth 2 (two) times daily with a meal.   NON FORMULARY    Place 2 L into the nose continuous. OXYGEN   OMEPRAZOLE (PRILOSEC) 40 MG CAPSULE    Take 40 mg by mouth daily.   POTASSIUM CHLORIDE SA (K-DUR,KLOR-CON) 20 MEQ TABLET    Take 20 mEq by mouth 2 (two) times daily.   SENNA (SENOKOT) 8.6 MG TABS TABLET    Take 1 tablet by mouth 2 (two) times daily.   SOTALOL (BETAPACE) 160 MG TABLET    Take 320 mg by mouth 2 (two) times daily.   WARFARIN (COUMADIN) 1 MG TABLET    Take 3.5 mg by mouth daily.  Modified Medications   No medications on file  Discontinued Medications   No medications on file    SIGNIFICANT DIAGNOSTIC EXAMS  05-25-13: chest x-ray: 1. Mild chf with stable moderate cardiomegaly and minimal to mild diffuse interstitial pulmonary edema. 2. Moderately large right pleuarl effusion and associated dense passive atelectasis in  the right lower lobe. \  05-26-13: right lower extremity doppler: 1. Negative for dvt of the right lower extremity. 2. Abnormal waveforms throughout the right lower venous system, favor tricuspid valve regurgitation. 5 x 2 x 2 cm cyst in the popliteal fossa, usually baker's cyst.   05-29-13: chest x-ray: stable bilateral pleural effusions  LABS REVIEWED:   05-25-13 wbc 5.4; hgb 12.5; hct 38.4; mcv 89; plt 135; glucose 101; bun 17;creat 0.77; k+4.5; na++131 BNP 3717   U/a: neg  05-28-13; glucose 114; bun 10; creat 0.76; k+3.9; na++131 05-29-13: glucose 109; bun 11; creat 0.76; k+4.0; na++130 05-30-13: glucose 112; bun 12; creat 0.75; k+3.9; na++131       Review of Systems  Constitutional: Positive for malaise/fatigue.  Eyes: Negative for blurred vision.  Respiratory: Positive for cough, sputum production, shortness of breath and wheezing.        Chronic 02 use;   Cardiovascular: Negative for chest pain, palpitations and leg swelling.   Gastrointestinal: Negative for heartburn, abdominal pain and constipation.  Musculoskeletal: Negative for joint pain and myalgias.  Skin: Negative.   Neurological: Negative for dizziness, weakness and headaches.  Psychiatric/Behavioral: Negative for depression. The patient does not have insomnia.     Physical Exam  Constitutional: She is oriented to person, place, and time. No distress.  frail  Neck: Neck supple. No JVD present.  Cardiovascular: Normal rate and intact distal pulses.   Respiratory: Effort normal. No respiratory distress. She has wheezes.  Has wheezes at bases  GI: Soft. Bowel sounds are normal. She exhibits no distension. There is no tenderness.  Musculoskeletal: Normal range of motion. She exhibits no edema.  Lymphadenopathy:    She has no cervical adenopathy.  Neurological: She is alert and oriented to person, place, and time.  Skin: Skin is warm and dry. She is not diaphoretic.  Lower extremities discolored due to pvd  Psychiatric: She has a normal mood and affect.      ASSESSMENT/ PLAN:  1. Acute on chronic systolic heart failure: she is presently stable: will continue lasix 40 mg twice daily with k+ 20 meq twice daily; will continue coreg 6.25 mg twice daily will continue daily weights and will monitor her status  2. Afib/valv replacement/anticoagulation management: her heart rate is stable will continue her long term coumadin therapy her inr goal is 2.5-3.5. Will continue betapace 160 mg twice daily for rate control and  for her inr of   2.3 will increase her coumadin to 4 mg daily and will check inr on Monday      Will monitor her status   3. Diabetes: will continue her glipizide xl 10 mg twice daily and metformin 500 mg twice daily and will monitor  4. Hypothyroidism: will continue synthroid 100 mcg daily   5. Gerd: will continue prilosec 40 mg daily   6. Constipation; will continue colace twice daily and senna twice daily and will monitor   7.  Chronic respiratory failure/copd: will continue her 02 and will continue to monitor her status.   Time spent with patient 50 minutes.

## 2013-06-04 ENCOUNTER — Encounter: Payer: Self-pay | Admitting: Internal Medicine

## 2013-06-04 ENCOUNTER — Non-Acute Institutional Stay (SKILLED_NURSING_FACILITY): Payer: Medicare PPO | Admitting: Internal Medicine

## 2013-06-04 DIAGNOSIS — I5043 Acute on chronic combined systolic (congestive) and diastolic (congestive) heart failure: Secondary | ICD-10-CM

## 2013-06-04 DIAGNOSIS — E039 Hypothyroidism, unspecified: Secondary | ICD-10-CM

## 2013-06-04 DIAGNOSIS — K5900 Constipation, unspecified: Secondary | ICD-10-CM

## 2013-06-04 DIAGNOSIS — J9620 Acute and chronic respiratory failure, unspecified whether with hypoxia or hypercapnia: Secondary | ICD-10-CM

## 2013-06-04 DIAGNOSIS — Z7901 Long term (current) use of anticoagulants: Secondary | ICD-10-CM

## 2013-06-04 DIAGNOSIS — E1159 Type 2 diabetes mellitus with other circulatory complications: Secondary | ICD-10-CM

## 2013-06-04 DIAGNOSIS — K219 Gastro-esophageal reflux disease without esophagitis: Secondary | ICD-10-CM

## 2013-06-04 DIAGNOSIS — I4891 Unspecified atrial fibrillation: Secondary | ICD-10-CM

## 2013-06-04 NOTE — Progress Notes (Signed)
Patient ID: [name] Perkins, female   DOB: 08/13/1931, 80 y.o.   MRN: [id number]    [facility]    PCP: No primary provider on file.   Allergies  Allergen Reactions  . Contrast Media [Iodinated Diagnostic Agents]   . Erythromycin   . Parafon Forte Dsc [Chlorzoxazone]   . Penicillins   . Septra [Sulfamethoxazole-Tmp Ds]     Chief Complaint: new admit  HPI:  80 y/o female patient is here for STR after hospital admission with acute respiratory failure in setting of chf and COPD. She has been working with therapy team here. Her breathing has been stable. Goal is for her to return home after rehabilitation. She denies any complaints this visit. She is on oxygen  Review of Systems:  Constitutional: Negative for fever, chills, weight loss, malaise/fatigue and diaphoresis.  HENT: Negative for congestion, hearing loss and sore throat.   Eyes: Negative for blurred vision, double vision and discharge.  Respiratory: Negative for cough, sputum production, shortness of breath and wheezing.   Cardiovascular: Negative for chest pain, palpitations, orthopnea and leg swelling.  Gastrointestinal: Negative for heartburn, nausea, vomiting, abdominal pain, diarrhea and constipation.  Genitourinary: Negative for dysuria, urgency, frequency and flank pain.  Musculoskeletal: Negative for back pain, falls, joint pain and myalgias.  Skin: has groin rash Neurological: Positive for weakness. Negative for dizziness, tingling, focal weakness and headaches.  Psychiatric/Behavioral: Negative for depression and memory loss. The patient is not nervous/anxious.     Past Medical History  Diagnosis Date  . CHF (congestive heart failure)   . Pleural effusion, right   . A-fib   . Thyroid disease   . Chronic rheumatic heart disease   . Diabetes mellitus without complication   . COPD (chronic obstructive pulmonary disease)   . Chronic respiratory failure   . Cardiomyopathy     ef 45%  . Closed left  subtrochanteric femur fracture   . Bladder tumor    Past Surgical History  Procedure Laterality Date  . Pacemaker insertion    . Mitral valve replacement      St. Jude mechanical  . Left femur orif    . Bladder tumor excision     Social History:   reports that she has never smoked. She does not have any smokeless tobacco history on file. Her alcohol and drug histories are not on file.  No family history on file.  Medications: Patient's Medications  New Prescriptions   No medications on file  Previous Medications   CARVEDILOL (COREG) 6.25 MG TABLET    Take 6.25 mg by mouth 2 (two) times daily with a meal.   DOCUSATE SODIUM (COLACE) 100 MG CAPSULE    Take 100 mg by mouth 2 (two) times daily.   FUROSEMIDE (LASIX) 40 MG TABLET    Take 40 mg by mouth 2 (two) times daily.   GLIPIZIDE (GLUCOTROL XL) 10 MG 24 HR TABLET    Take 10 mg by mouth 2 (two) times daily with a meal.   LEVOTHYROXINE (SYNTHROID, LEVOTHROID) 100 MCG TABLET    Take 100 mcg by mouth daily before breakfast.   METFORMIN (GLUCOPHAGE) 500 MG TABLET    Take 500 mg by mouth 2 (two) times daily with a meal.   NON FORMULARY    Place 2 L into the nose continuous. OXYGEN   OMEPRAZOLE (PRILOSEC) 40 MG CAPSULE    Take 40 mg by mouth daily.   POTASSIUM CHLORIDE SA (K-DUR,KLOR-CON) 20 MEQ TABLET  Take 20 mEq by mouth 2 (two) times daily.   SENNA (SENOKOT) 8.6 MG TABS TABLET    Take 1 tablet by mouth 2 (two) times daily.   SOTALOL (BETAPACE) 160 MG TABLET    Take 320 mg by mouth 2 (two) times daily.   WARFARIN (COUMADIN) 1 MG TABLET    Take 3.5 mg by mouth daily.  Modified Medications   No medications on file  Discontinued Medications   No medications on file     Physical Exam:  Filed Vitals:   06/04/13 2218  BP: 130/76  Pulse: 88  Temp: 97.4 F (36.3 C)  Resp: 16  SpO2: 95%   General- elderly female in no acute distress Head- atraumatic, normocephalic Eyes- PERRLA, EOMI, no pallor, no icterus Neck- no  lymphadenopathy, no thyromegaly Chest- no chest wall deformities Cardiovascular- normal s1,s2, no murmurs/ rubs/ gallops Respiratory- bilateral clear to auscultation, on o2 by [place] Skin- pacemaker in place, groin rash, stage 1 ulcer in buttock Abdomen- bowel sounds present, soft, non tender Musculoskeletal- able to move all 4 extremities, weakness in extremities Neurological- no focal deficit Psychiatry- alert and oriented to person, place and time, normal mood and affect  Labs reviewed: 05-25-13 wbc 5.4; hgb 12.5; hct 38.4; mcv 89; plt 135; glucose 101; bun 17;creat 0.77; k+4.5; na++131 BNP 3717   U/a: neg   05-28-13; glucose 114; bun 10; creat 0.76; k+3.9; na++131 05-29-13: glucose 109; bun 11; creat 0.76; k+4.0; na++130 05-30-13: glucose 112; bun 12; creat 0.75; k+3.9; na++131    Radiological Exams: 05-25-13: chest x-ray: 1. Mild chf with stable moderate cardiomegaly and minimal to mild diffuse interstitial pulmonary edema. 2. Moderately large right pleuarl effusion and associated dense passive atelectasis in the right lower lobe. \  05-26-13: right lower extremity doppler: 1. Negative for dvt of the right lower extremity. 2. Abnormal waveforms throughout the right lower venous system, favor tricuspid valve regurgitation. 5 x 2 x 2 cm cyst in the popliteal fossa, usually baker's cyst.   05-29-13: chest x-ray: stable bilateral pleural effusions   Assessment/Plan  Chronic respiratory failure- with recent acute exacerbation in setting of chf and copd. Will have her work with therapy team for strengthening exercise prior to home discharge  CHF- stable at present. Weight remains stable. Continue coreg, lasix 40 mg bid with kcl.   COPD- continue o2 by nasal canula monitor breathing status  afib- rate controlled. Continue coreg, sotalol and coumadin, inr reviewed.   Constipation- continue senna and colace. Add miralax daily for now  Diabetes-continue glipizide xl 10 mg twice daily and metformin  500 mg twice daily and will monitor cbg  Hypothyroidism- will continue synthroid 100 mcg daily   Jerrye Bushy- will continue prilosec 40 mg daily   Long term anticoagulation- inr today is 2.9. Continue coumadin at 4 mg daily. Goal inr 2.5-3.5  Family/ staff Communication: reviewed care plan with patient and nursing supervisor   Goals of care: STR   Labs/tests ordered: bmp, inr

## 2013-06-11 ENCOUNTER — Non-Acute Institutional Stay (SKILLED_NURSING_FACILITY): Payer: Medicare PPO | Admitting: Adult Health

## 2013-06-11 ENCOUNTER — Encounter: Payer: Self-pay | Admitting: Adult Health

## 2013-06-11 DIAGNOSIS — Z7901 Long term (current) use of anticoagulants: Secondary | ICD-10-CM

## 2013-06-11 DIAGNOSIS — Z950 Presence of cardiac pacemaker: Secondary | ICD-10-CM

## 2013-06-11 DIAGNOSIS — Z954 Presence of other heart-valve replacement: Secondary | ICD-10-CM

## 2013-06-11 DIAGNOSIS — Z952 Presence of prosthetic heart valve: Secondary | ICD-10-CM

## 2013-06-11 DIAGNOSIS — I4891 Unspecified atrial fibrillation: Secondary | ICD-10-CM

## 2013-06-11 LAB — BASIC METABOLIC PANEL
BUN: 23 mg/dL — AB (ref 4–21)
CREATININE: 0.7 mg/dL (ref 0.5–1.1)
Glucose: 86 mg/dL
POTASSIUM: 4.9 mmol/L (ref 3.4–5.3)
SODIUM: 129 mmol/L — AB (ref 137–147)

## 2013-06-11 NOTE — Progress Notes (Signed)
Patient ID: [name] Perkins, female   DOB: Sep 05, 1931, 80 y.o.   MRN: [id number]     [facility]  Allergies  Allergen Reactions  . Contrast Media [Iodinated Diagnostic Agents]   . Erythromycin   . Parafon Forte Dsc [Chlorzoxazone]   . Penicillins   . Septra [Sulfamethoxazole-Tmp Ds]      Chief Complaint  Patient presents with  . Acute Visit    coumadin management     HPI:  She is on long term coumadin therapy for her afib. Her heart rate is under control at this time. Her inr today is 2.3 and she is taking coumadin 4 mg daily. There are no concerns being voiced by the nursing staff at this time.    Past Medical History  Diagnosis Date  . CHF (congestive heart failure)   . Pleural effusion, right   . A-fib   . Thyroid disease   . Chronic rheumatic heart disease   . Diabetes mellitus without complication   . COPD (chronic obstructive pulmonary disease)   . Chronic respiratory failure   . Cardiomyopathy     ef 45%  . Closed left subtrochanteric femur fracture   . Bladder tumor     Past Surgical History  Procedure Laterality Date  . Pacemaker insertion    . Mitral valve replacement      St. Jude mechanical  . Left femur orif    . Bladder tumor excision      VITAL SIGNS BP 126/70  Pulse 62  Ht 5' (1.524 m)  Wt 129 lb (58.514 kg)  BMI 25.19 kg/m2   Patient's Medications  New Prescriptions   No medications on file  Previous Medications   CARVEDILOL (COREG) 6.25 MG TABLET    Take 6.25 mg by mouth 2 (two) times daily with a meal.   DOCUSATE SODIUM (COLACE) 100 MG CAPSULE    Take 100 mg by mouth 2 (two) times daily.   FUROSEMIDE (LASIX) 40 MG TABLET    Take 40 mg by mouth 2 (two) times daily.   GLIPIZIDE (GLUCOTROL XL) 10 MG 24 HR TABLET    Take 10 mg by mouth 2 (two) times daily with a meal.   LEVOTHYROXINE (SYNTHROID, LEVOTHROID) 100 MCG TABLET    Take 100 mcg by mouth daily before breakfast.   METFORMIN (GLUCOPHAGE) 500 MG TABLET    Take 500 mg by mouth 2  (two) times daily with a meal.   NON FORMULARY    Place 2 L into the nose continuous. OXYGEN   OMEPRAZOLE (PRILOSEC) 40 MG CAPSULE    Take 40 mg by mouth daily.   POTASSIUM CHLORIDE SA (K-DUR,KLOR-CON) 20 MEQ TABLET    Take 20 mEq by mouth 2 (two) times daily.   SENNA (SENOKOT) 8.6 MG TABS TABLET    Take 1 tablet by mouth 2 (two) times daily.   SOTALOL (BETAPACE) 160 MG TABLET    Take 320 mg by mouth 2 (two) times daily.   WARFARIN (COUMADIN) 1 MG TABLET    Take 4 mg by mouth daily.   Modified Medications   No medications on file  Discontinued Medications   No medications on file    SIGNIFICANT DIAGNOSTIC EXAMS  05-25-13: chest x-ray: 1. Mild chf with stable moderate cardiomegaly and minimal to mild diffuse interstitial pulmonary edema. 2. Moderately large right pleuarl effusion and associated dense passive atelectasis in the right lower lobe. \  05-26-13: right lower extremity doppler: 1. Negative for dvt of the right lower  extremity. 2. Abnormal waveforms throughout the right lower venous system, favor tricuspid valve regurgitation. 5 x 2 x 2 cm cyst in the popliteal fossa, usually baker's cyst.   05-29-13: chest x-ray: stable bilateral pleural effusions  LABS REVIEWED:   05-25-13 wbc 5.4; hgb 12.5; hct 38.4; mcv 89; plt 135; glucose 101; bun 17;creat 0.77; k+4.5; na++131 BNP 3717   U/a: neg  05-28-13; glucose 114; bun 10; creat 0.76; k+3.9; na++131 05-29-13: glucose 109; bun 11; creat 0.76; k+4.0; na++130 05-30-13: glucose 112; bun 12; creat 0.75; k+3.9; na++131       Review of Systems  Constitutional: negative for fatigue  Eyes: Negative for blurred vision.  Respiratory: negative for cough or shortness of breath    Cardiovascular: Negative for chest pain, palpitations and leg swelling.  Gastrointestinal: Negative for heartburn, abdominal pain and constipation.  Musculoskeletal: Negative for joint pain and myalgias.  Skin: Negative.   Neurological: Negative for dizziness, weakness and  headaches.  Psychiatric/Behavioral: Negative for depression. The patient does not have insomnia.     Physical Exam  Constitutional: She is oriented to person, place, and time. No distress.  frail  Neck: Neck supple. No JVD present.  Cardiovascular: Normal rate and intact distal pulses.   Respiratory: Effort normal. No respiratory distress. She has wheezes.  Has few fine wheezes at bases  GI: Soft. Bowel sounds are normal. She exhibits no distension. There is no tenderness.  Musculoskeletal: Normal range of motion. She exhibits no edema.  Lymphadenopathy:    She has no cervical adenopathy.  Neurological: She is alert and oriented to person, place, and time.  Skin: Skin is warm and dry. She is not diaphoretic.  Lower extremities discolored due to pvd  Psychiatric: She has a normal mood and affect.       ASSESSMENT/ PLAN:  1. Afib/mitral valve replacement/anticoagulation management: for her inr of 2.3 will continue coumadin 4 mg daily and will check inr in one week. Will monitor    Ok Edwards NP [facility]  Contact [phone] Monday through Friday 8am- 5pm  After hours call [phone]

## 2013-06-12 DIAGNOSIS — Z952 Presence of prosthetic heart valve: Secondary | ICD-10-CM | POA: Insufficient documentation

## 2013-06-18 ENCOUNTER — Non-Acute Institutional Stay (SKILLED_NURSING_FACILITY): Payer: Medicare PPO | Admitting: Adult Health

## 2013-06-18 ENCOUNTER — Encounter: Payer: Self-pay | Admitting: Adult Health

## 2013-06-18 DIAGNOSIS — I5043 Acute on chronic combined systolic (congestive) and diastolic (congestive) heart failure: Secondary | ICD-10-CM

## 2013-06-18 DIAGNOSIS — I4891 Unspecified atrial fibrillation: Secondary | ICD-10-CM

## 2013-06-18 DIAGNOSIS — J9620 Acute and chronic respiratory failure, unspecified whether with hypoxia or hypercapnia: Secondary | ICD-10-CM

## 2013-06-18 DIAGNOSIS — Z952 Presence of prosthetic heart valve: Secondary | ICD-10-CM

## 2013-06-18 DIAGNOSIS — Z7901 Long term (current) use of anticoagulants: Secondary | ICD-10-CM

## 2013-06-18 DIAGNOSIS — J449 Chronic obstructive pulmonary disease, unspecified: Secondary | ICD-10-CM

## 2013-06-18 DIAGNOSIS — Z954 Presence of other heart-valve replacement: Secondary | ICD-10-CM

## 2013-06-18 MED ORDER — WARFARIN SODIUM 1 MG PO TABS: 4.5000 mg | ORAL_TABLET | Freq: Every day | ORAL | Status: DC

## 2013-06-18 NOTE — Progress Notes (Signed)
Patient ID: [name] Perkins, female   DOB: 1931-05-31, 80 y.o.   MRN: [id number]     [facility]  Allergies  Allergen Reactions  . Contrast Media [Iodinated Diagnostic Agents]   . Erythromycin   . Parafon Forte Dsc [Chlorzoxazone]   . Penicillins   . Septra [Sulfamethoxazole-Tmp Ds]      Chief Complaint  Patient presents with  . Discharge Note    HPI:  She is being dishcarged to home with home health for pt/ot/nursing. She will not need dme. She will need prescriptions to be written. She had been hospitalized for copd and chf.. Her inr today is 2.0 and she is taking coumadin 4 mg daily.   Past Medical History  Diagnosis Date  . CHF (congestive heart failure)   . Pleural effusion, right   . A-fib   . Thyroid disease   . Chronic rheumatic heart disease   . Diabetes mellitus without complication   . COPD (chronic obstructive pulmonary disease)   . Chronic respiratory failure   . Cardiomyopathy     ef 45%  . Closed left subtrochanteric femur fracture   . Bladder tumor     Past Surgical History  Procedure Laterality Date  . Pacemaker insertion    . Mitral valve replacement      St. Jude mechanical  . Left femur orif    . Bladder tumor excision      VITAL SIGNS BP 109/58  Pulse 63  Wt 132 lb 6.4 oz (60.056 kg)   Patient's Medications  New Prescriptions   No medications on file  Previous Medications   CARVEDILOL (COREG) 6.25 MG TABLET    Take 6.25 mg by mouth 2 (two) times daily with a meal.   DOCUSATE SODIUM (COLACE) 100 MG CAPSULE    Take 100 mg by mouth 2 (two) times daily.   FUROSEMIDE (LASIX) 40 MG TABLET    Take 40 mg by mouth 2 (two) times daily.   GLIPIZIDE (GLUCOTROL XL) 10 MG 24 HR TABLET    Take 10 mg by mouth 2 (two) times daily with a meal.   LEVOTHYROXINE (SYNTHROID, LEVOTHROID) 100 MCG TABLET    Take 100 mcg by mouth daily before breakfast.   METFORMIN (GLUCOPHAGE) 500 MG TABLET    Take 500 mg by mouth 2 (two) times daily with a meal.   NON  FORMULARY    Place 2 L into the nose continuous. OXYGEN   OMEPRAZOLE (PRILOSEC) 40 MG CAPSULE    Take 40 mg by mouth daily.   POTASSIUM CHLORIDE SA (K-DUR,KLOR-CON) 20 MEQ TABLET    Take 20 mEq by mouth 2 (two) times daily.   SENNA (SENOKOT) 8.6 MG TABS TABLET    Take 1 tablet by mouth 2 (two) times daily.   SOTALOL (BETAPACE) 160 MG TABLET    Take 320 mg by mouth 2 (two) times daily.   WARFARIN (COUMADIN) 1 MG TABLET    Take 4 mg by mouth daily.   Modified Medications   No medications on file  Discontinued Medications   No medications on file    SIGNIFICANT DIAGNOSTIC EXAMS  05-25-13: chest x-ray: 1. Mild chf with stable moderate cardiomegaly and minimal to mild diffuse interstitial pulmonary edema. 2. Moderately large right pleuarl effusion and associated dense passive atelectasis in the right lower lobe. \  05-26-13: right lower extremity doppler: 1. Negative for dvt of the right lower extremity. 2. Abnormal waveforms throughout the right lower venous system, favor tricuspid valve regurgitation. 5  x 2 x 2 cm cyst in the popliteal fossa, usually baker's cyst.   05-29-13: chest x-ray: stable bilateral pleural effusions  LABS REVIEWED:   05-25-13 wbc 5.4; hgb 12.5; hct 38.4; mcv 89; plt 135; glucose 101; bun 17;creat 0.77; k+4.5; na++131 BNP 3717   U/a: neg  05-28-13; glucose 114; bun 10; creat 0.76; k+3.9; na++131 05-29-13: glucose 109; bun 11; creat 0.76; k+4.0; na++130 05-30-13: glucose 112; bun 12; creat 0.75; k+3.9; na++131  06-11-13: glucose 86; bun 23; creat 0.7; k+ 4.9; na++129       Review of Systems  Constitutional: negative for fatigue  Eyes: Negative for blurred vision.  Respiratory: negative for cough or shortness of breath    Cardiovascular: Negative for chest pain, palpitations and leg swelling.  Gastrointestinal: Negative for heartburn, abdominal pain and constipation.  Musculoskeletal: Negative for joint pain and myalgias.  Skin: Negative.   Neurological: Negative for  dizziness, weakness and headaches.  Psychiatric/Behavioral: Negative for depression. The patient does not have insomnia.     Physical Exam  Constitutional: She is oriented to person, place, and time. No distress.  frail  Neck: Neck supple. No JVD present.  Cardiovascular: Normal rate and intact distal pulses.   Respiratory: Effort normal. No respiratory distress. She has no wheezes  GI: Soft. Bowel sounds are normal. She exhibits no distension. There is no tenderness.  Musculoskeletal: Normal range of motion. She exhibits no edema.  Lymphadenopathy:    She has no cervical adenopathy.  Neurological: She is alert and oriented to person, place, and time.  Skin: Skin is warm and dry. She is not diaphoretic.  Lower extremities discolored due to pvd  Psychiatric: She has a normal mood and affect.       ASSESSMENT/ PLAN:  Will discharge to home with home health for pt/ot/nursing and coumadin management. No dme required; prescriptions have been written.  Will increase her coumadin to 4.5 mg daily and will have home health check her inr on 06-22-13.   Time spent with patient 35 minutes.    Ok Edwards NP [facility]  Contact [phone] Monday through Friday 8am- 5pm  After hours call [phone]

## 2013-09-20 ENCOUNTER — Inpatient Hospital Stay: Payer: Self-pay

## 2013-09-20 LAB — BASIC METABOLIC PANEL
Anion Gap: 7 (ref 7–16)
BUN: 18 mg/dL (ref 7–18)
CREATININE: 0.81 mg/dL (ref 0.60–1.30)
Calcium, Total: 9.7 mg/dL (ref 8.5–10.1)
Chloride: 92 mmol/L — ABNORMAL LOW (ref 98–107)
Co2: 30 mmol/L (ref 21–32)
EGFR (African American): >60
EGFR (Non-African Amer.): >60
Glucose: 126 mg/dL — ABNORMAL HIGH (ref 65–99)
OSMOLALITY: 262 (ref 275–301)
POTASSIUM: 4.7 mmol/L (ref 3.5–5.1)
SODIUM: 129 mmol/L — AB (ref 136–145)

## 2013-09-20 LAB — CBC
HCT: 34.9 % — ABNORMAL LOW (ref 35.0–47.0)
HGB: 11.4 g/dL — ABNORMAL LOW (ref 12.0–16.0)
MCH: 29 pg (ref 26.0–34.0)
MCHC: 32.6 g/dL (ref 32.0–36.0)
MCV: 89 fL (ref 80–100)
PLATELETS: 176 10*3/uL (ref 150–440)
RBC: 3.92 10*6/uL (ref 3.80–5.20)
RDW: 17.5 % — ABNORMAL HIGH (ref 11.5–14.5)
WBC: 6.8 10*3/uL (ref 3.6–11.0)

## 2013-09-20 LAB — CBC AND DIFFERENTIAL
HEMATOCRIT: 35 % — AB (ref 36–46)
Hemoglobin: 11.4 g/dL — AB (ref 12.0–16.0)
Platelets: 176 10*3/uL (ref 150–399)
WBC: 6.8 10^3/mL

## 2013-09-20 LAB — PROTIME-INR
INR: 3
Prothrombin Time: 29.9 secs — ABNORMAL HIGH (ref 11.5–14.7)

## 2013-09-20 LAB — TROPONIN I
Troponin-I: <0.02 ng/mL
Troponin-I: <0.02 ng/mL

## 2013-09-20 LAB — CK-MB: CK-MB: 2 ng/mL (ref 0.5–3.6)

## 2013-09-20 LAB — PRO B NATRIURETIC PEPTIDE: B-Type Natriuretic Peptide: 4935 pg/mL — ABNORMAL HIGH (ref 0–450)

## 2013-09-21 LAB — BASIC METABOLIC PANEL
Anion Gap: 6 — ABNORMAL LOW (ref 7–16)
BUN: 19 mg/dL — AB (ref 7–18)
CALCIUM: 9 mg/dL (ref 8.5–10.1)
CO2: 30 mmol/L (ref 21–32)
Chloride: 94 mmol/L — ABNORMAL LOW (ref 98–107)
Creatinine: 0.97 mg/dL (ref 0.60–1.30)
EGFR (Non-African Amer.): 56 — ABNORMAL LOW
Glucose: 185 mg/dL — ABNORMAL HIGH (ref 65–99)
OSMOLALITY: 268 (ref 275–301)
Potassium: 4.4 mmol/L (ref 3.5–5.1)
Sodium: 130 mmol/L — ABNORMAL LOW (ref 136–145)

## 2013-09-21 LAB — CBC WITH DIFFERENTIAL/PLATELET
Basophil #: 0.1 10*3/uL (ref 0.0–0.1)
Basophil %: 1 %
EOS ABS: 0.1 10*3/uL (ref 0.0–0.7)
Eosinophil %: 2.3 %
HCT: 31.5 % — AB (ref 35.0–47.0)
HGB: 10.3 g/dL — ABNORMAL LOW (ref 12.0–16.0)
LYMPHS PCT: 14.9 %
Lymphocyte #: 0.8 10*3/uL — ABNORMAL LOW (ref 1.0–3.6)
MCH: 29 pg (ref 26.0–34.0)
MCHC: 32.7 g/dL (ref 32.0–36.0)
MCV: 89 fL (ref 80–100)
MONO ABS: 0.8 x10 3/mm (ref 0.2–0.9)
Monocyte %: 15 %
NEUTROS ABS: 3.6 10*3/uL (ref 1.4–6.5)
NEUTROS PCT: 66.8 %
Platelet: 131 10*3/uL — ABNORMAL LOW (ref 150–440)
RBC: 3.55 10*6/uL — ABNORMAL LOW (ref 3.80–5.20)
RDW: 17.4 % — ABNORMAL HIGH (ref 11.5–14.5)
WBC: 5.4 10*3/uL (ref 3.6–11.0)

## 2013-09-21 LAB — CBC AND DIFFERENTIAL
HCT: 32 % — AB (ref 36–46)
Hemoglobin: 10.3 g/dL — AB (ref 12.0–16.0)
Platelets: 131 10*3/uL — AB (ref 150–399)
WBC: 0.5 10^3/mL

## 2013-09-21 LAB — CK-MB: CK-MB: 1.9 ng/mL (ref 0.5–3.6)

## 2013-09-21 LAB — MAGNESIUM
MAGNESIUM: 2 mg/dL (ref 1.6–2.4)
MAGNESIUM: 2 mg/dL

## 2013-09-22 LAB — BASIC METABOLIC PANEL
Anion Gap: 1 — ABNORMAL LOW (ref 7–16)
BUN: 18 mg/dL (ref 4–21)
BUN: 18 mg/dL (ref 7–18)
CALCIUM: 8.8 mg/dL (ref 8.5–10.1)
CHLORIDE: 93 mmol/L — AB (ref 98–107)
CREATININE: 1 mg/dL (ref 0.5–1.1)
Co2: 36 mmol/L — ABNORMAL HIGH (ref 21–32)
Creatinine: 1.03 mg/dL (ref 0.60–1.30)
EGFR (Non-African Amer.): 52 — ABNORMAL LOW
GFR CALC AF AMER: 60 — AB
Glucose: 148 mg/dL
Glucose: 148 mg/dL — ABNORMAL HIGH (ref 65–99)
OSMOLALITY: 265 (ref 275–301)
POTASSIUM: 4.2 mmol/L (ref 3.4–5.3)
Potassium: 4.2 mmol/L (ref 3.5–5.1)
Sodium: 130 mmol/L — AB (ref 137–147)
Sodium: 130 mmol/L — ABNORMAL LOW (ref 136–145)

## 2013-09-22 LAB — PROTIME-INR
INR: 3.5
Prothrombin Time: 34.2 secs — ABNORMAL HIGH (ref 11.5–14.7)

## 2013-09-23 LAB — BASIC METABOLIC PANEL
ANION GAP: 3 — AB (ref 7–16)
BUN: 17 mg/dL (ref 7–18)
BUN: 17 mg/dL (ref 4–21)
CO2: 37 mmol/L — AB (ref 21–32)
Calcium, Total: 8.8 mg/dL (ref 8.5–10.1)
Chloride: 91 mmol/L — ABNORMAL LOW (ref 98–107)
Creatinine: 0.7 mg/dL (ref 0.5–1.1)
Creatinine: 0.73 mg/dL (ref 0.60–1.30)
EGFR (African American): >60
GLUCOSE: 105 mg/dL — AB (ref 65–99)
GLUCOSE: 105 mg/dL
Osmolality: 265 (ref 275–301)
Potassium: 4.2 mmol/L (ref 3.4–5.3)
Potassium: 4.2 mmol/L (ref 3.5–5.1)
Sodium: 131 mmol/L — AB (ref 137–147)
Sodium: 131 mmol/L — ABNORMAL LOW (ref 136–145)

## 2013-09-23 LAB — PROTIME-INR
INR: 3.4
Prothrombin Time: 33.6 secs — ABNORMAL HIGH (ref 11.5–14.7)

## 2013-09-24 LAB — BASIC METABOLIC PANEL
Anion Gap: 2 — ABNORMAL LOW (ref 7–16)
BUN: 17 mg/dL (ref 4–21)
BUN: 17 mg/dL (ref 7–18)
CALCIUM: 8.8 mg/dL (ref 8.5–10.1)
CHLORIDE: 94 mmol/L — AB (ref 98–107)
CO2: 35 mmol/L — AB (ref 21–32)
CREATININE: 0.7 mg/dL (ref 0.5–1.1)
Creatinine: 0.74 mg/dL (ref 0.60–1.30)
EGFR (African American): >60
EGFR (Non-African Amer.): >60
GLUCOSE: 115 mg/dL
Glucose: 115 mg/dL — ABNORMAL HIGH (ref 65–99)
Osmolality: 265 (ref 275–301)
POTASSIUM: 4.1 mmol/L (ref 3.5–5.1)
POTASSIUM: 4.1 mmol/L (ref 3.4–5.3)
Sodium: 131 mmol/L — AB (ref 137–147)
Sodium: 131 mmol/L — ABNORMAL LOW (ref 136–145)

## 2013-09-24 LAB — PROTIME-INR
INR: 3.1
Prothrombin Time: 31 secs — ABNORMAL HIGH (ref 11.5–14.7)

## 2013-09-26 ENCOUNTER — Non-Acute Institutional Stay (SKILLED_NURSING_FACILITY): Payer: Medicare PPO | Admitting: Adult Health

## 2013-09-26 ENCOUNTER — Encounter: Payer: Self-pay | Admitting: Adult Health

## 2013-09-26 DIAGNOSIS — Z954 Presence of other heart-valve replacement: Secondary | ICD-10-CM

## 2013-09-26 DIAGNOSIS — Z950 Presence of cardiac pacemaker: Secondary | ICD-10-CM

## 2013-09-26 DIAGNOSIS — I5043 Acute on chronic combined systolic (congestive) and diastolic (congestive) heart failure: Secondary | ICD-10-CM

## 2013-09-26 DIAGNOSIS — Z7901 Long term (current) use of anticoagulants: Secondary | ICD-10-CM

## 2013-09-26 DIAGNOSIS — K219 Gastro-esophageal reflux disease without esophagitis: Secondary | ICD-10-CM

## 2013-09-26 DIAGNOSIS — Z952 Presence of prosthetic heart valve: Secondary | ICD-10-CM

## 2013-09-26 DIAGNOSIS — I4891 Unspecified atrial fibrillation: Secondary | ICD-10-CM

## 2013-09-26 DIAGNOSIS — E039 Hypothyroidism, unspecified: Secondary | ICD-10-CM

## 2013-09-26 DIAGNOSIS — E1159 Type 2 diabetes mellitus with other circulatory complications: Secondary | ICD-10-CM

## 2013-09-26 DIAGNOSIS — J441 Chronic obstructive pulmonary disease with (acute) exacerbation: Secondary | ICD-10-CM | POA: Insufficient documentation

## 2013-09-26 NOTE — Progress Notes (Signed)
Patient ID: [name] Perkins, female   DOB: 1932/01/16, 80 y.o.   MRN: [id number]     [facility]  Allergies  Allergen Reactions  . Contrast Media [Iodinated Diagnostic Agents]   . Erythromycin   . Parafon Forte Dsc [Chlorzoxazone]   . Penicillins   . Septra [Sulfamethoxazole-Tmp Ds]      Chief Complaint  Patient presents with  . Hospitalization Follow-up    HPI:  She has been hospitalized for an exacerbation of her copd; acute on chronic diastolic heart failure. She is here for short term rehab with her goal to return back home. She is presently not voicing any complaints or concerns the nursing staff is not voicing any concerns at this time. She has been living with family prior to her hospitalization   Past Medical History  Diagnosis Date  . CHF (congestive heart failure)   . Pleural effusion, right   . A-fib   . Thyroid disease   . Chronic rheumatic heart disease   . Diabetes mellitus without complication   . COPD (chronic obstructive pulmonary disease)   . Chronic respiratory failure   . Cardiomyopathy     ef 45%  . Closed left subtrochanteric femur fracture   . Bladder tumor     Past Surgical History  Procedure Laterality Date  . Pacemaker insertion    . Mitral valve replacement      St. Jude mechanical  . Left femur orif    . Bladder tumor excision      VITAL SIGNS BP 126/81  Pulse 73  Ht 5' (1.524 m)  Wt 142 lb 12.8 oz (64.774 kg)  BMI 27.89 kg/m2  SpO2 99%   Patient's Medications  New Prescriptions   No medications on file  Previous Medications   ACETAMINOPHEN (TYLENOL) 500 MG TABLET    Take 1,000 mg by mouth every 6 (six) hours as needed.   BENZONATATE (TESSALON) 200 MG CAPSULE    Take 200 mg by mouth 3 (three) times daily as needed for cough.   CARVEDILOL (COREG) 6.25 MG TABLET    Take 6.25 mg by mouth 2 (two) times daily with a meal.   FUROSEMIDE (LASIX) 40 MG TABLET    Take 40-80 mg by mouth 2 (two) times daily. Take 80 mg in the AM and 40  mg in the PM   GLIPIZIDE (GLUCOTROL XL) 10 MG 24 HR TABLET    Take 10 mg by mouth 2 (two) times daily with a meal.   LEVOTHYROXINE (SYNTHROID, LEVOTHROID) 100 MCG TABLET    Take 100 mcg by mouth daily before breakfast.   METFORMIN (GLUCOPHAGE) 500 MG TABLET    Take 500 mg by mouth 2 (two) times daily with a meal.   NON FORMULARY    Place 2 L into the nose continuous. OXYGEN   OMEPRAZOLE (PRILOSEC) 40 MG CAPSULE    Take 40 mg by mouth daily.   POTASSIUM CHLORIDE SA (K-DUR,KLOR-CON) 20 MEQ TABLET    Take 20 mEq by mouth 2 (two) times daily.   SOTALOL (BETAPACE) 160 MG TABLET    Take 160 mg by mouth 2 (two) times daily.   Modified Medications   Modified Medication Previous Medication   WARFARIN (COUMADIN) 1 MG TABLET warfarin (COUMADIN) 1 MG tablet      Take 3.5 mg by mouth daily at 6 PM.    Take 4.5 tablets (4.5 mg total) by mouth daily.  Discontinued Medications   DOCUSATE SODIUM (COLACE) 100 MG CAPSULE  Take 100 mg by mouth 2 (two) times daily.   SENNA (SENOKOT) 8.6 MG TABS TABLET    Take 1 tablet by mouth 2 (two) times daily.    SIGNIFICANT DIAGNOSTIC EXAMS  05-25-13: chest x-ray: 1. Mild chf with stable moderate cardiomegaly and minimal to mild diffuse interstitial pulmonary edema. 2. Moderately large right pleuarl effusion and associated dense passive atelectasis in the right lower lobe. \  05-26-13: right lower extremity doppler: 1. Negative for dvt of the right lower extremity. 2. Abnormal waveforms throughout the right lower venous system, favor tricuspid valve regurgitation. 5 x 2 x 2 cm cyst in the popliteal fossa, usually baker's cyst.   05-29-13: chest x-ray: stable bilateral pleural effusions  09-20-13: chest x-ray: 1. Cardiomegaly and pulmonary vascular congestion without overt edema. Moderate right pleural effusion and associated right basilar atelectasis    09-21-13: 2-d echo: ef 50-55%; low normal global left ventricular systolic function. Moderate left ventricular hypertrophy.  Severe tricuspid regurgitation. Mild to moderate mitral valve regurgitation. Severely increased left ventricular posterior wall thickness   09-23-13: chest x-ray; small right pleural effusion. Mild pulmonary vascular congestion without overt chf       LABS REVIEWED:   05-25-13 wbc 5.4; hgb 12.5; hct 38.4; mcv 89; plt 135; glucose 101; bun 17;creat 0.77; k+4.5; na++131 BNP 3717   U/a: neg  05-28-13; glucose 114; bun 10; creat 0.76; k+3.9; na++131 05-29-13: glucose 109; bun 11; creat 0.76; k+4.0; na++130 05-30-13: glucose 112; bun 12; creat 0.75; k+3.9; na++131  06-11-13: glucose 86; bun 23; creat 0.7; k+ 4.9; na++129  09-20-13: wbc 6.8; hgb 11.4; hct 34.9; mcv 89; plt 176 09-21-13: wbc 5.4; hgb 10.3; hct 31.5; mcv 89; plt 131; mag 2.0 09-22-13: glucose 148; bun 18; creat 1.03; k+4.2; na++130 09-23-13: glucose 105; bun 17; creat 0.73; k+4.2; na++131 09-24-13: glucose 115; bun 17;p creat 0.74; k+ 4.1; na++131        Review of Systems  Constitutional: negative for fatigue  Eyes: Negative for blurred vision.  Respiratory: negative for cough or shortness of breath    Cardiovascular: Negative for chest pain, palpitations and leg swelling.  Gastrointestinal: Negative for heartburn, abdominal pain and constipation.  Musculoskeletal: Negative for joint pain and myalgias.  Skin: Negative.   Neurological: Negative for dizziness, weakness and headaches.  Psychiatric/Behavioral: Negative for depression. The patient does not have insomnia.       Physical Exam  Constitutional: She is oriented to person, place, and time. No distress.  frail  Neck: Neck supple. No JVD present.  Cardiovascular: Normal rate and intact distal pulses.  heart rate irregular  Respiratory: Effort normal. No respiratory distress. She has no wheezes  GI: Soft. Bowel sounds are normal. She exhibits no distension. There is no tenderness.  Musculoskeletal: Normal range of motion. She exhibits no edema. kyphosis has trace pedal  edema  Lymphadenopathy:    She has no cervical adenopathy.  Neurological: She is alert and oriented to person, place, and time.  Skin: Skin is warm and dry. She is not diaphoretic.  Lower extremities discolored due to pvd  Psychiatric: She has a normal mood and affect.        ASSESSMENT/ PLAN:  1. Acute/chronic diastolic heart failure: she is currently stable will continue her lasix  80 mg in the am and 40 mg in the pm will continue k+ 20 meq twice daily; will continue her daily weights and will continue to monitor her status.   2. Hypothyroidism: is stable will continue synthroid 100  mcg daily and will monitor   3. Diabetes: is stable will continue metformin 500 mg twice daily and glipizide xl 10 mg twice daily and will monitor   4. COPD: is presenttly stable will continue 02 2L; will continue to monitor her status   5. Afib: is status post pacemaker insertion and status post mitral valve replacement;  her heart rate is under control will cotinue betapace 160 mg twice daily for rate control. Her inr is 1.7 on 3.5 mg daily will begin 4 mg daily and will check inr on Monday and will monitor  6. Gerd: will continue prilosec 40 mg daily   7. Physical deconditioning: will continue therapy as directed in order to better improve upon her strength; physical endurance; and independence with adl's; will monitor  Will check bmp next week.    Time spent with patient 50 minutes      Ok Edwards NP [facility]  Contact [phone] Monday through Friday 8am- 5pm  After hours call [phone]

## 2013-09-28 ENCOUNTER — Encounter: Payer: Self-pay | Admitting: Internal Medicine

## 2013-09-28 ENCOUNTER — Non-Acute Institutional Stay (SKILLED_NURSING_FACILITY): Payer: Medicare PPO | Admitting: Internal Medicine

## 2013-09-28 DIAGNOSIS — E039 Hypothyroidism, unspecified: Secondary | ICD-10-CM

## 2013-09-28 DIAGNOSIS — E1159 Type 2 diabetes mellitus with other circulatory complications: Secondary | ICD-10-CM | POA: Insufficient documentation

## 2013-09-28 DIAGNOSIS — J449 Chronic obstructive pulmonary disease, unspecified: Secondary | ICD-10-CM

## 2013-09-28 DIAGNOSIS — R5383 Other fatigue: Secondary | ICD-10-CM

## 2013-09-28 DIAGNOSIS — I798 Other disorders of arteries, arterioles and capillaries in diseases classified elsewhere: Secondary | ICD-10-CM

## 2013-09-28 DIAGNOSIS — K219 Gastro-esophageal reflux disease without esophagitis: Secondary | ICD-10-CM

## 2013-09-28 DIAGNOSIS — R531 Weakness: Secondary | ICD-10-CM | POA: Insufficient documentation

## 2013-09-28 DIAGNOSIS — I4891 Unspecified atrial fibrillation: Secondary | ICD-10-CM

## 2013-09-28 DIAGNOSIS — R5381 Other malaise: Secondary | ICD-10-CM

## 2013-09-28 DIAGNOSIS — I5043 Acute on chronic combined systolic (congestive) and diastolic (congestive) heart failure: Secondary | ICD-10-CM

## 2013-09-28 NOTE — Progress Notes (Signed)
Patient ID: [name] Perkins, female   DOB: Jul 22, 1931, 80 y.o.   MRN: [id number]     [facility]   PCP: No primary provider on file.  Code Status: full code  Allergies  Allergen Reactions  . Contrast Media [Iodinated Diagnostic Agents]   . Erythromycin   . Parafon Forte Dsc [Chlorzoxazone]   . Penicillins   . Septra [Sulfamethoxazole-Tmp Ds]     Chief Complaint: new admission  HPI:  80 y/o female patient is here for STR after hospital admission from 09/20/13- 09/24/13 with COPD and chf exacerbation. She has history of copd, CHF, afib and MVR on anticoagulation. She is seen in her room. She is working with therapy team. She feels weak and gets tired easily but feels has improved somewhat. Her appetite is fair. Denies dyspnea at present and is on chronic oxygen. Denies any complaints. No concerns from staff.  Review of Systems:  Constitutional: Negative for fever, chills, weight loss, diaphoresis.  HENT: Negative for congestion, hearing loss and sore throat.   Eyes: Negative for eye pain, blurred vision, double vision and discharge.  Respiratory: Negative for cough, sputum production, wheezing.   Cardiovascular: Negative for chest pain, palpitations, orthopnea and leg swelling.  Gastrointestinal: Negative for heartburn, nausea, vomiting, abdominal pain, diarrhea and constipation.  Genitourinary: Negative for dysuria, urgency, frequency, hematuria and flank pain.  Musculoskeletal: Negative for back pain, falls, joint pain and myalgias.  Skin: Negative for itching and rash.  Neurological: Negative for dizziness, tingling, focal weakness and headaches.  Psychiatric/Behavioral: Negative for depression and memory loss. The patient is not nervous/anxious.     Past Medical History  Diagnosis Date  . CHF (congestive heart failure)   . Pleural effusion, right   . A-fib   . Thyroid disease   . Chronic rheumatic heart disease   . Diabetes mellitus without complication   . COPD  (chronic obstructive pulmonary disease)   . Chronic respiratory failure   . Cardiomyopathy     ef 45%  . Closed left subtrochanteric femur fracture   . Bladder tumor    Past Surgical History  Procedure Laterality Date  . Pacemaker insertion    . Mitral valve replacement      St. Jude mechanical  . Left femur orif    . Bladder tumor excision     Social History:   reports that she has never smoked. She does not have any smokeless tobacco history on file. Her alcohol and drug histories are not on file.  No family history on file.  Medications: Patient's Medications  New Prescriptions   No medications on file  Previous Medications   ACETAMINOPHEN (TYLENOL) 500 MG TABLET    Take 1,000 mg by mouth every 6 (six) hours as needed.   BENZONATATE (TESSALON) 200 MG CAPSULE    Take 200 mg by mouth 3 (three) times daily as needed for cough.   CARVEDILOL (COREG) 6.25 MG TABLET    Take 6.25 mg by mouth 2 (two) times daily with a meal.   FUROSEMIDE (LASIX) 40 MG TABLET    Take 40-80 mg by mouth 2 (two) times daily. Take 80 mg in the AM and 40 mg in the PM   GLIPIZIDE (GLUCOTROL XL) 10 MG 24 HR TABLET    Take 10 mg by mouth 2 (two) times daily with a meal.   LEVOTHYROXINE (SYNTHROID, LEVOTHROID) 100 MCG TABLET    Take 100 mcg by mouth daily before breakfast.   METFORMIN (GLUCOPHAGE) 500 MG TABLET  Take 500 mg by mouth 2 (two) times daily with a meal.   NON FORMULARY    Place 2 L into the nose continuous. OXYGEN   OMEPRAZOLE (PRILOSEC) 40 MG CAPSULE    Take 40 mg by mouth daily.   POTASSIUM CHLORIDE SA (K-DUR,KLOR-CON) 20 MEQ TABLET    Take 20 mEq by mouth 2 (two) times daily.   SOTALOL (BETAPACE) 160 MG TABLET    Take 160 mg by mouth 2 (two) times daily.    WARFARIN (COUMADIN) 1 MG TABLET    Take 3.5 mg by mouth daily at 6 PM.  Modified Medications   No medications on file  Discontinued Medications   No medications on file     Physical Exam: Filed Vitals:   09/28/13 1417  BP: 150/89    Pulse: 81  Temp: 98.5 F (36.9 C)  Resp: 18  SpO2: 99%    General- elderly frail female in no acute distress Head- atraumatic, normocephalic Eyes- PERRLA, EOMI, no pallor, no icterus Neck- no lymphadenopathy, no thyromegaly, no jugular vein distension Throat- moist mucus membrane, normal oropharynx Cardiovascular- irregular heart rate, no murmurs Respiratory- bilateral clear to auscultation, no wheeze, no rhonchi, no crackles, no use of accessory muscles Abdomen- bowel sounds present, soft, non tender Musculoskeletal- able to move all 4 extremities, no leg edema, generalized weakness Neurological- no focal deficit Skin- warm and dry Psychiatry- alert and oriented to person, place and time, normal mood and affect   Labs reviewed: Basic Metabolic Panel:  Recent Labs  06/11/13 09/21/13 09/22/13 09/23/13 09/24/13  NA 129*  --  130* 131* 131*  K 4.9  --  4.2 4.2 4.1  BUN 23*  --  18 17 17   CREATININE 0.7  --  1.0 0.7 0.7  MG  --  2.0  --   --   --    CBC:  Recent Labs  09/20/13 09/21/13  WBC 6.8 0.5  HGB 11.4* 10.3*  HCT 35* 32*  PLT 176 131*    Radiological Exams: 09-20-13: chest x-ray: 1. Cardiomegaly and pulmonary vascular congestion without overt edema. Moderate right pleural effusion and associated right basilar atelectasis    09-21-13: 2-d echo: ef 50-55%; low normal global left ventricular systolic function. Moderate left ventricular hypertrophy. Severe tricuspid regurgitation. Mild to moderate mitral valve regurgitation. Severely increased left ventricular posterior wall thickness   09-23-13: chest x-ray; small right pleural effusion. Mild pulmonary vascular congestion without overt chf   Assessment/Plan  Generalized weakness From deconditioning, Will have her work with physical therapy and occupational therapy team to help with gait training and muscle strengthening exercises.fall precautions. Skin care. Encourage to be out of bed. Monitor oral  intake  CHF Recent acute exacerbation. Currently weight stable and breathing is stable. Continue current regimen of coreg, lasix and monitor her weight.  Copd Continue o2 to keep o2 sat > 90 at rest. Not on any bronchodilator. Will have her on prn proventil for now and consider LABA as outpatient  afib Stable, s/p pacemaker, continue sotalol 160 mg bid , coreg 6.25 mg bid and coumadin with goal inr 2.5-3.5  Hypothyroidism continue synthroid 100 mcg daily   Dm Monitor cbg, continue metformin 500 mg twice daily and glipizide xl 10 mg twice daily   Jerrye Bushy continue prilosec 40 mg daily    Family/ staff Communication: reviewed care plan with patient and nursing supervisor   Goals of care: short term rehabilitation    Labs/tests ordered: bmp in 1 week  Blanchie Serve, MD  [facility] [phone] (Monday-Friday 8 am - 5 pm) [phone] (afterhours)

## 2013-10-01 ENCOUNTER — Non-Acute Institutional Stay (SKILLED_NURSING_FACILITY): Payer: Medicare PPO | Admitting: Adult Health

## 2013-10-01 DIAGNOSIS — Z950 Presence of cardiac pacemaker: Secondary | ICD-10-CM

## 2013-10-01 DIAGNOSIS — I4819 Other persistent atrial fibrillation: Secondary | ICD-10-CM

## 2013-10-01 DIAGNOSIS — Z952 Presence of prosthetic heart valve: Secondary | ICD-10-CM

## 2013-10-01 DIAGNOSIS — Z7901 Long term (current) use of anticoagulants: Secondary | ICD-10-CM

## 2013-10-01 DIAGNOSIS — Z954 Presence of other heart-valve replacement: Secondary | ICD-10-CM

## 2013-10-01 DIAGNOSIS — I4891 Unspecified atrial fibrillation: Secondary | ICD-10-CM

## 2013-10-11 ENCOUNTER — Non-Acute Institutional Stay (SKILLED_NURSING_FACILITY): Payer: Medicare PPO | Admitting: Adult Health

## 2013-10-11 ENCOUNTER — Encounter: Payer: Self-pay | Admitting: Adult Health

## 2013-10-11 DIAGNOSIS — J441 Chronic obstructive pulmonary disease with (acute) exacerbation: Secondary | ICD-10-CM

## 2013-10-11 DIAGNOSIS — E039 Hypothyroidism, unspecified: Secondary | ICD-10-CM

## 2013-10-11 DIAGNOSIS — E1159 Type 2 diabetes mellitus with other circulatory complications: Secondary | ICD-10-CM

## 2013-10-11 DIAGNOSIS — I5043 Acute on chronic combined systolic (congestive) and diastolic (congestive) heart failure: Secondary | ICD-10-CM

## 2013-10-11 DIAGNOSIS — I482 Chronic atrial fibrillation, unspecified: Secondary | ICD-10-CM

## 2013-10-11 DIAGNOSIS — I4891 Unspecified atrial fibrillation: Secondary | ICD-10-CM

## 2013-10-11 NOTE — Progress Notes (Signed)
Patient ID: [name] Perkins, female   DOB: Mar 24, 1932, 80 y.o.   MRN: [id number]     [facility]  Allergies  Allergen Reactions  . Contrast Media [Iodinated Diagnostic Agents]   . Erythromycin   . Parafon Forte Dsc [Chlorzoxazone]   . Penicillins   . Septra [Sulfamethoxazole-Tmp Ds]      Chief Complaint  Patient presents with  . Discharge Note    HPI:  She is being discharged to home with home health for pt/ot/rn/aid/coumadin management. She will need inr on 10-15-13. She will not need dme. She will need prescriptions to be written.  She had been hospitalized for an acute exacerbation of copd.    Past Medical History  Diagnosis Date  . CHF (congestive heart failure)   . Pleural effusion, right   . A-fib   . Thyroid disease   . Chronic rheumatic heart disease   . Diabetes mellitus without complication   . COPD (chronic obstructive pulmonary disease)   . Chronic respiratory failure   . Cardiomyopathy     ef 45%  . Closed left subtrochanteric femur fracture   . Bladder tumor     Past Surgical History  Procedure Laterality Date  . Pacemaker insertion    . Mitral valve replacement      St. Jude mechanical  . Left femur orif    . Bladder tumor excision      VITAL SIGNS BP 120/72  Pulse 76  Ht 5' (1.524 m)  Wt 143 lb (64.864 kg)  BMI 27.93 kg/m2  SpO2 95%   Patient's Medications  New Prescriptions   No medications on file  Previous Medications   ACETAMINOPHEN (TYLENOL) 500 MG TABLET    Take 1,000 mg by mouth every 6 (six) hours as needed.   BENZONATATE (TESSALON) 200 MG CAPSULE    Take 200 mg by mouth 3 (three) times daily as needed for cough.   CARVEDILOL (COREG) 6.25 MG TABLET    Take 6.25 mg by mouth 2 (two) times daily with a meal.   FUROSEMIDE (LASIX) 40 MG TABLET    Take 40-80 mg by mouth 2 (two) times daily. Take 80 mg in the AM and 40 mg in the PM   GLIPIZIDE (GLUCOTROL XL) 10 MG 24 HR TABLET    Take 10 mg by mouth 2 (two) times daily with a meal.     LEVOTHYROXINE (SYNTHROID, LEVOTHROID) 100 MCG TABLET    Take 100 mcg by mouth daily before breakfast.   METFORMIN (GLUCOPHAGE) 500 MG TABLET    Take 500 mg by mouth 2 (two) times daily with a meal.   NON FORMULARY    Place 2 L into the nose continuous. OXYGEN   OMEPRAZOLE (PRILOSEC) 40 MG CAPSULE    Take 40 mg by mouth daily.   POTASSIUM CHLORIDE SA (K-DUR,KLOR-CON) 20 MEQ TABLET    Take 20 mEq by mouth 2 (two) times daily.   SOTALOL (BETAPACE) 160 MG TABLET    Take 160 mg by mouth 2 (two) times daily.    WARFARIN (COUMADIN) 1 MG TABLET    Take 5 mg by mouth daily at 6 PM.   Modified Medications   No medications on file  Discontinued Medications   No medications on file    SIGNIFICANT DIAGNOSTIC EXAMS   05-25-13: chest x-ray: 1. Mild chf with stable moderate cardiomegaly and minimal to mild diffuse interstitial pulmonary edema. 2. Moderately large right pleuarl effusion and associated dense passive atelectasis in the right lower lobe. \  05-26-13: right lower extremity doppler: 1. Negative for dvt of the right lower extremity. 2. Abnormal waveforms throughout the right lower venous system, favor tricuspid valve regurgitation. 5 x 2 x 2 cm cyst in the popliteal fossa, usually baker's cyst.   05-29-13: chest x-ray: stable bilateral pleural effusions  09-20-13: chest x-ray: 1. Cardiomegaly and pulmonary vascular congestion without overt edema. Moderate right pleural effusion and associated right basilar atelectasis    09-21-13: 2-d echo: ef 50-55%; low normal global left ventricular systolic function. Moderate left ventricular hypertrophy. Severe tricuspid regurgitation. Mild to moderate mitral valve regurgitation. Severely increased left ventricular posterior wall thickness   09-23-13: chest x-ray; small right pleural effusion. Mild pulmonary vascular congestion without overt chf       LABS REVIEWED:   05-25-13 wbc 5.4; hgb 12.5; hct 38.4; mcv 89; plt 135; glucose 101; bun 17;creat 0.77;  k+4.5; na++131 BNP 3717   U/a: neg  05-28-13; glucose 114; bun 10; creat 0.76; k+3.9; na++131 05-29-13: glucose 109; bun 11; creat 0.76; k+4.0; na++130 05-30-13: glucose 112; bun 12; creat 0.75; k+3.9; na++131  06-11-13: glucose 86; bun 23; creat 0.7; k+ 4.9; na++129  09-20-13: wbc 6.8; hgb 11.4; hct 34.9; mcv 89; plt 176 09-21-13: wbc 5.4; hgb 10.3; hct 31.5; mcv 89; plt 131; mag 2.0 09-22-13: glucose 148; bun 18; creat 1.03; k+4.2; na++130 09-23-13: glucose 105; bun 17; creat 0.73; k+4.2; na++131 09-24-13: glucose 115; bun 17;p creat 0.74; k+ 4.1; na++131        Review of Systems  Constitutional: negative for fatigue  Eyes: Negative for blurred vision.  Respiratory: negative for cough or shortness of breath    Cardiovascular: Negative for chest pain, palpitations and leg swelling.  Gastrointestinal: Negative for heartburn, abdominal pain and constipation.  Musculoskeletal: Negative for joint pain and myalgias.  Skin: Negative.   Neurological: Negative for dizziness, weakness and headaches.  Psychiatric/Behavioral: Negative for depression. The patient does not have insomnia.       Physical Exam  Constitutional: She is oriented to person, place, and time. No distress.  frail  Neck: Neck supple. No JVD present.  Cardiovascular: Normal rate and intact distal pulses.  heart rate irregular  Respiratory: Effort normal. No respiratory distress. She has no wheezes  GI: Soft. Bowel sounds are normal. She exhibits no distension. There is no tenderness.  Musculoskeletal: Normal range of motion. She exhibits no edema. kyphosis has trace pedal edema  Lymphadenopathy:    She has no cervical adenopathy.  Neurological: She is alert and oriented to person, place, and time.  Skin: Skin is warm and dry. She is not diaphoretic.  Lower extremities discolored due to pvd  Psychiatric: She has a normal mood and affect.      ASSESSMENT/ PLAN:   Will discharge to home with home health for  pt/ot/rn/aid/coumadin monitoring. Will need inr on 10-15-13. Will not need dme. Her prescriptions have been written.    Time spent with patient 40 minutes.      Ok Edwards NP [facility]  Contact [phone] Monday through Friday 8am- 5pm  After hours call [phone]

## 2013-10-17 ENCOUNTER — Ambulatory Visit: Payer: Self-pay

## 2013-10-18 NOTE — Progress Notes (Signed)
Patient ID: [name] Perkins, female   DOB: 1931-09-23, 80 y.o.   MRN: [id number]     [facility]  Allergies  Allergen Reactions  . Contrast Media [Iodinated Diagnostic Agents]   . Erythromycin   . Parafon Forte Dsc [Chlorzoxazone]   . Penicillins   . Septra [Sulfamethoxazole-Tmp Ds]      Chief Complaint  Patient presents with  . Acute Visit    couomadin management     HPI:  She is being seen for her coumadin management. She has afib and is on long term coumadin. Her inr today is 1.6 and she is taking coumadin 4 mg daily. There are no missed doses. There are no concerns being voiced by the nursing staff at this time.    Past Medical History  Diagnosis Date  . CHF (congestive heart failure)   . Pleural effusion, right   . A-fib   . Thyroid disease   . Chronic rheumatic heart disease   . Diabetes mellitus without complication   . COPD (chronic obstructive pulmonary disease)   . Chronic respiratory failure   . Cardiomyopathy     ef 45%  . Closed left subtrochanteric femur fracture   . Bladder tumor     Past Surgical History  Procedure Laterality Date  . Pacemaker insertion    . Mitral valve replacement      St. Jude mechanical  . Left femur orif    . Bladder tumor excision      VITAL SIGNS BP 129/79  Pulse 68  Ht 5' (1.524 m)  Wt 143 lb (64.864 kg)  BMI 27.93 kg/m2   Patient's Medications  New Prescriptions   No medications on file  Previous Medications   ACETAMINOPHEN (TYLENOL) 500 MG TABLET    Take 1,000 mg by mouth every 6 (six) hours as needed.   BENZONATATE (TESSALON) 200 MG CAPSULE    Take 200 mg by mouth 3 (three) times daily as needed for cough.   CARVEDILOL (COREG) 6.25 MG TABLET    Take 6.25 mg by mouth 2 (two) times daily with a meal.   FUROSEMIDE (LASIX) 40 MG TABLET    Take 40-80 mg by mouth 2 (two) times daily. Take 80 mg in the AM and 40 mg in the PM   GLIPIZIDE (GLUCOTROL XL) 10 MG 24 HR TABLET    Take 10 mg by mouth 2 (two) times daily  with a meal.   LEVOTHYROXINE (SYNTHROID, LEVOTHROID) 100 MCG TABLET    Take 100 mcg by mouth daily before breakfast.   METFORMIN (GLUCOPHAGE) 500 MG TABLET    Take 500 mg by mouth 2 (two) times daily with a meal.   NON FORMULARY    Place 2 L into the nose continuous. OXYGEN   OMEPRAZOLE (PRILOSEC) 40 MG CAPSULE    Take 40 mg by mouth daily.   POTASSIUM CHLORIDE SA (K-DUR,KLOR-CON) 20 MEQ TABLET    Take 20 mEq by mouth 2 (two) times daily.   SOTALOL (BETAPACE) 160 MG TABLET    Take 160 mg by mouth 2 (two) times daily.    WARFARIN (COUMADIN) 1 MG TABLET    Take 5 mg by mouth daily at 6 PM.   Modified Medications   No medications on file  Discontinued Medications   No medications on file    SIGNIFICANT DIAGNOSTIC EXAMS  05-25-13: chest x-ray: 1. Mild chf with stable moderate cardiomegaly and minimal to mild diffuse interstitial pulmonary edema. 2. Moderately large right pleuarl effusion and associated  dense passive atelectasis in the right lower lobe. \  05-26-13: right lower extremity doppler: 1. Negative for dvt of the right lower extremity. 2. Abnormal waveforms throughout the right lower venous system, favor tricuspid valve regurgitation. 5 x 2 x 2 cm cyst in the popliteal fossa, usually baker's cyst.   05-29-13: chest x-ray: stable bilateral pleural effusions  09-20-13: chest x-ray: 1. Cardiomegaly and pulmonary vascular congestion without overt edema. Moderate right pleural effusion and associated right basilar atelectasis    09-21-13: 2-d echo: ef 50-55%; low normal global left ventricular systolic function. Moderate left ventricular hypertrophy. Severe tricuspid regurgitation. Mild to moderate mitral valve regurgitation. Severely increased left ventricular posterior wall thickness   09-23-13: chest x-ray; small right pleural effusion. Mild pulmonary vascular congestion without overt chf       LABS REVIEWED:   05-25-13 wbc 5.4; hgb 12.5; hct 38.4; mcv 89; plt 135; glucose 101; bun  17;creat 0.77; k+4.5; na++131 BNP 3717   U/a: neg  05-28-13; glucose 114; bun 10; creat 0.76; k+3.9; na++131 05-29-13: glucose 109; bun 11; creat 0.76; k+4.0; na++130 05-30-13: glucose 112; bun 12; creat 0.75; k+3.9; na++131  06-11-13: glucose 86; bun 23; creat 0.7; k+ 4.9; na++129  09-20-13: wbc 6.8; hgb 11.4; hct 34.9; mcv 89; plt 176 09-21-13: wbc 5.4; hgb 10.3; hct 31.5; mcv 89; plt 131; mag 2.0 09-22-13: glucose 148; bun 18; creat 1.03; k+4.2; na++130 09-23-13: glucose 105; bun 17; creat 0.73; k+4.2; na++131 09-24-13: glucose 115; bun 17;p creat 0.74; k+ 4.1; na++131        Review of Systems  Constitutional: negative for fatigue  Eyes: Negative for blurred vision.  Respiratory: negative for cough or shortness of breath    Cardiovascular: Negative for chest pain, palpitations and leg swelling.  Gastrointestinal: Negative for heartburn, abdominal pain and constipation.  Musculoskeletal: Negative for joint pain and myalgias.  Skin: Negative.   Neurological: Negative for dizziness, weakness and headaches.  Psychiatric/Behavioral: Negative for depression. The patient does not have insomnia.       Physical Exam  Constitutional: She is oriented to person, place, and time. No distress.  frail  Neck: Neck supple. No JVD present.  Cardiovascular: Normal rate and intact distal pulses.  heart rate irregular  Respiratory: Effort normal. No respiratory distress. She has no wheezes  GI: Soft. Bowel sounds are normal. She exhibits no distension. There is no tenderness.  Musculoskeletal: Normal range of motion. She exhibits no edema. kyphosis has trace pedal edema  Lymphadenopathy:    She has no cervical adenopathy.  Neurological: She is alert and oriented to person, place, and time.  Skin: Skin is warm and dry. She is not diaphoretic.  Lower extremities discolored due to pvd  Psychiatric: She has a normal mood and affect.        ASSESSMENT/ PLAN:   1.. Afib/anticoagulation management : is  status post pacemaker insertion and status post mitral valve replacement;  her heart rate is under control will cotinue betapace 160 mg twice daily for rate control. Her inr is 1.4 on 4 mg daily will begin 45mg  daily and will check inr on Wednesday and will monitor

## 2013-12-08 ENCOUNTER — Inpatient Hospital Stay: Payer: Self-pay | Admitting: Internal Medicine

## 2013-12-08 LAB — APTT: Activated PTT: 34.3 secs (ref 23.6–35.9)

## 2013-12-08 LAB — COMPREHENSIVE METABOLIC PANEL
ALK PHOS: 116 U/L
Albumin: 3.4 g/dL (ref 3.4–5.0)
Anion Gap: 5 — ABNORMAL LOW (ref 7–16)
BUN: 20 mg/dL — ABNORMAL HIGH (ref 7–18)
Bilirubin,Total: 0.6 mg/dL (ref 0.2–1.0)
CALCIUM: 8.4 mg/dL — AB (ref 8.5–10.1)
CREATININE: 1.23 mg/dL (ref 0.60–1.30)
Chloride: 100 mmol/L (ref 98–107)
Co2: 30 mmol/L (ref 21–32)
GFR CALC AF AMER: 48 — AB
GFR CALC NON AF AMER: 42 — AB
Glucose: 123 mg/dL — ABNORMAL HIGH (ref 65–99)
Osmolality: 274 (ref 275–301)
POTASSIUM: 4.5 mmol/L (ref 3.5–5.1)
SGOT(AST): 24 U/L (ref 15–37)
SGPT (ALT): 19 U/L
Sodium: 135 mmol/L — ABNORMAL LOW (ref 136–145)
Total Protein: 7.4 g/dL (ref 6.4–8.2)

## 2013-12-08 LAB — CBC
HCT: 34.9 % — ABNORMAL LOW (ref 35.0–47.0)
HGB: 11.3 g/dL — ABNORMAL LOW (ref 12.0–16.0)
MCH: 27.9 pg (ref 26.0–34.0)
MCHC: 32.4 g/dL (ref 32.0–36.0)
MCV: 86 fL (ref 80–100)
PLATELETS: 169 10*3/uL (ref 150–440)
RBC: 4.05 10*6/uL (ref 3.80–5.20)
RDW: 15.6 % — ABNORMAL HIGH (ref 11.5–14.5)
WBC: 6.2 10*3/uL (ref 3.6–11.0)

## 2013-12-08 LAB — TROPONIN I: TROPONIN-I: 0.06 ng/mL — AB

## 2013-12-08 LAB — CK TOTAL AND CKMB (NOT AT ARMC)
CK, Total: 47 U/L
CK-MB: 0.6 ng/mL (ref 0.5–3.6)

## 2013-12-08 LAB — PROTIME-INR
INR: 1.4
Prothrombin Time: 17.3 secs — ABNORMAL HIGH (ref 11.5–14.7)

## 2013-12-08 LAB — PRO B NATRIURETIC PEPTIDE: B-Type Natriuretic Peptide: 4915 pg/mL — ABNORMAL HIGH (ref 0–450)

## 2013-12-09 LAB — TROPONIN I
Troponin-I: 0.07 ng/mL — ABNORMAL HIGH
Troponin-I: 0.07 ng/mL — ABNORMAL HIGH

## 2013-12-09 LAB — CK-MB
CK-MB: 0.8 ng/mL (ref 0.5–3.6)
CK-MB: 0.7 ng/mL (ref 0.5–3.6)

## 2013-12-09 LAB — HEPARIN LEVEL (UNFRACTIONATED): Anti-Xa(Unfractionated): 0.62 IU/mL (ref 0.30–0.70)

## 2013-12-09 LAB — PROTIME-INR
INR: 1.6
Prothrombin Time: 18.3 secs — ABNORMAL HIGH (ref 11.5–14.7)

## 2013-12-10 LAB — CBC WITH DIFFERENTIAL/PLATELET
BASOS ABS: 0.1 10*3/uL (ref 0.0–0.1)
Basophil %: 1.1 %
EOS ABS: 0.2 10*3/uL (ref 0.0–0.7)
EOS PCT: 4.6 %
HCT: 33.5 % — ABNORMAL LOW (ref 35.0–47.0)
HGB: 10.5 g/dL — ABNORMAL LOW (ref 12.0–16.0)
Lymphocyte #: 0.9 10*3/uL — ABNORMAL LOW (ref 1.0–3.6)
Lymphocyte %: 17 %
MCH: 27.5 pg (ref 26.0–34.0)
MCHC: 31.5 g/dL — ABNORMAL LOW (ref 32.0–36.0)
MCV: 88 fL (ref 80–100)
MONO ABS: 0.9 x10 3/mm (ref 0.2–0.9)
MONOS PCT: 16.8 %
Neutrophil #: 3.2 10*3/uL (ref 1.4–6.5)
Neutrophil %: 60.5 %
Platelet: 133 10*3/uL — ABNORMAL LOW (ref 150–440)
RBC: 3.83 10*6/uL (ref 3.80–5.20)
RDW: 15.5 % — AB (ref 11.5–14.5)
WBC: 5.3 10*3/uL (ref 3.6–11.0)

## 2013-12-10 LAB — BASIC METABOLIC PANEL
Anion Gap: 7 (ref 7–16)
BUN: 17 mg/dL (ref 7–18)
CO2: 32 mmol/L (ref 21–32)
CREATININE: 0.94 mg/dL (ref 0.60–1.30)
Calcium, Total: 8.5 mg/dL (ref 8.5–10.1)
Chloride: 98 mmol/L (ref 98–107)
EGFR (African American): >60
EGFR (Non-African Amer.): 58 — ABNORMAL LOW
Glucose: 108 mg/dL — ABNORMAL HIGH (ref 65–99)
Osmolality: 276 (ref 275–301)
Potassium: 4.5 mmol/L (ref 3.5–5.1)
Sodium: 137 mmol/L (ref 136–145)

## 2013-12-10 LAB — PROTIME-INR
INR: 2
Prothrombin Time: 22.4 secs — ABNORMAL HIGH (ref 11.5–14.7)

## 2013-12-10 LAB — HEPARIN LEVEL (UNFRACTIONATED): Anti-Xa(Unfractionated): 0.64 IU/mL (ref 0.30–0.70)

## 2013-12-11 LAB — PROTIME-INR
INR: 2.6
PROTHROMBIN TIME: 27.5 s — AB (ref 11.5–14.7)

## 2013-12-11 LAB — CBC WITH DIFFERENTIAL/PLATELET
BASOS ABS: 0.1 10*3/uL (ref 0.0–0.1)
Basophil %: 1.2 %
Eosinophil #: 0.2 10*3/uL (ref 0.0–0.7)
Eosinophil %: 5.2 %
HCT: 32.7 % — AB (ref 35.0–47.0)
HGB: 10.2 g/dL — ABNORMAL LOW (ref 12.0–16.0)
LYMPHS PCT: 17.8 %
Lymphocyte #: 0.8 10*3/uL — ABNORMAL LOW (ref 1.0–3.6)
MCH: 27.4 pg (ref 26.0–34.0)
MCHC: 31.3 g/dL — ABNORMAL LOW (ref 32.0–36.0)
MCV: 88 fL (ref 80–100)
Monocyte #: 0.7 x10 3/mm (ref 0.2–0.9)
Monocyte %: 15.6 %
NEUTROS PCT: 60.2 %
Neutrophil #: 2.8 10*3/uL (ref 1.4–6.5)
Platelet: 137 10*3/uL — ABNORMAL LOW (ref 150–440)
RBC: 3.73 10*6/uL — ABNORMAL LOW (ref 3.80–5.20)
RDW: 15.7 % — ABNORMAL HIGH (ref 11.5–14.5)
WBC: 4.6 10*3/uL (ref 3.6–11.0)

## 2013-12-11 LAB — BASIC METABOLIC PANEL
ANION GAP: 9 (ref 7–16)
BUN: 17 mg/dL (ref 7–18)
CHLORIDE: 97 mmol/L — AB (ref 98–107)
CO2: 31 mmol/L (ref 21–32)
CREATININE: 0.9 mg/dL (ref 0.60–1.30)
Calcium, Total: 8.5 mg/dL (ref 8.5–10.1)
EGFR (African American): >60
EGFR (Non-African Amer.): >60
Glucose: 123 mg/dL — ABNORMAL HIGH (ref 65–99)
OSMOLALITY: 277 (ref 275–301)
Potassium: 4.7 mmol/L (ref 3.5–5.1)
Sodium: 137 mmol/L (ref 136–145)

## 2013-12-13 ENCOUNTER — Non-Acute Institutional Stay (SKILLED_NURSING_FACILITY): Payer: Commercial Managed Care - HMO | Admitting: Internal Medicine

## 2013-12-13 ENCOUNTER — Encounter: Payer: Self-pay | Admitting: Internal Medicine

## 2013-12-13 DIAGNOSIS — Z7901 Long term (current) use of anticoagulants: Secondary | ICD-10-CM | POA: Insufficient documentation

## 2013-12-13 DIAGNOSIS — R5381 Other malaise: Secondary | ICD-10-CM

## 2013-12-13 DIAGNOSIS — R791 Abnormal coagulation profile: Secondary | ICD-10-CM

## 2013-12-13 DIAGNOSIS — E039 Hypothyroidism, unspecified: Secondary | ICD-10-CM

## 2013-12-13 DIAGNOSIS — J189 Pneumonia, unspecified organism: Secondary | ICD-10-CM

## 2013-12-13 DIAGNOSIS — I5032 Chronic diastolic (congestive) heart failure: Secondary | ICD-10-CM

## 2013-12-13 DIAGNOSIS — N189 Chronic kidney disease, unspecified: Secondary | ICD-10-CM

## 2013-12-13 DIAGNOSIS — R5383 Other fatigue: Secondary | ICD-10-CM

## 2013-12-13 DIAGNOSIS — E1122 Type 2 diabetes mellitus with diabetic chronic kidney disease: Secondary | ICD-10-CM

## 2013-12-13 DIAGNOSIS — E876 Hypokalemia: Secondary | ICD-10-CM

## 2013-12-13 DIAGNOSIS — E1129 Type 2 diabetes mellitus with other diabetic kidney complication: Secondary | ICD-10-CM

## 2013-12-13 DIAGNOSIS — J449 Chronic obstructive pulmonary disease, unspecified: Secondary | ICD-10-CM

## 2013-12-13 DIAGNOSIS — K219 Gastro-esophageal reflux disease without esophagitis: Secondary | ICD-10-CM

## 2013-12-13 DIAGNOSIS — I509 Heart failure, unspecified: Secondary | ICD-10-CM

## 2013-12-13 DIAGNOSIS — I5030 Unspecified diastolic (congestive) heart failure: Secondary | ICD-10-CM | POA: Insufficient documentation

## 2013-12-13 DIAGNOSIS — R531 Weakness: Secondary | ICD-10-CM

## 2013-12-13 NOTE — Progress Notes (Signed)
Patient ID: [name] Perkins, female   DOB: Oct 08, 1931, 80 y.o.   MRN: [id number]      Facility: [facility]   Code Status: full code  Allergies  Allergen Reactions  . Contrast Media [Iodinated Diagnostic Agents]   . Erythromycin   . Parafon Forte Dsc [Chlorzoxazone]   . Penicillins   . Septra [Sulfamethoxazole-Tmp Ds]     Chief Complaint  Patient presents with  . Hospitalization Follow-up    admission visit    HPI:  80 y/o female patient is here for STR after hospital admission from 12/08/13- 12/11/13 with shortness of breath. Chest xray showed pneumonia and fluid overload. She had acute respiratory failure and demand ischemia. Cardiology was consulted. Iv antibiotics were initiated and with her clinical improvement she was switched to po ceftin. She was noted to have generalized weakness and STR was recommended. She is seen in her room today. She is on oxygen by nasal canula and in no distress. She feels weak and gets tired easily. She denies any other complaints. No concerns from staff. She has history of copd, chronic respiratory failure on chronic oxygen by nasal canula, chronic diastolic chf.  Review of Systems:  Constitutional: Negative for fever, chills, diaphoresis.  HENT: Negative for congestion, hearing loss and sore throat.   Eyes: Negative for eye pain, blurred vision, double vision and discharge.  Respiratory: Negative for cough, sputum production, shortness of breath and wheezing.   Cardiovascular: Negative for chest pain, palpitations, orthopnea. Has some leg swelling.  Gastrointestinal: Negative for heartburn, nausea, vomiting, abdominal pain, diarrhea and constipation.  Genitourinary: Negative for dysuria, flank pain.  Musculoskeletal: Negative for back pain, falls and myalgias.  Skin: Negative for itching and rash.  Neurological: Negative for dizziness, tingling, focal weakness and headaches.  Psychiatric/Behavioral: Negative for  depression and memory loss. The patient is not nervous/anxious.     Past Medical History  Diagnosis Date  . CHF (congestive heart failure)   . Pleural effusion, right   . A-fib   . Thyroid disease   . Chronic rheumatic heart disease   . Diabetes mellitus without complication   . COPD (chronic obstructive pulmonary disease)   . Chronic respiratory failure   . Cardiomyopathy     ef 45%  . Closed left subtrochanteric femur fracture   . Bladder tumor    Past Surgical History  Procedure Laterality Date  . Pacemaker insertion    . Mitral valve replacement      St. Jude mechanical  . Left femur orif    . Bladder tumor excision     Social History:   reports that she has never smoked. She does not have any smokeless tobacco history on file. Her alcohol and drug histories are not on file.  No family history on file.  Medications: Patient's Medications  New Prescriptions   No medications on file  Previous Medications   ASPIRIN 81 MG TABLET    Take 81 mg by mouth daily.   CARVEDILOL (COREG) 6.25 MG TABLET    Take 6.25 mg by mouth 2 (two) times daily with a meal.   CEFUROXIME (CEFTIN) 500 MG TABLET    Take 500 mg by mouth 2 (two) times daily with a meal. Until 12/21/13 for pneumonia   FUROSEMIDE (LASIX) 40 MG TABLET    Take 40 mg by mouth daily. Take 80 mg in the AM and 40 mg in the PM   IPRATROPIUM-ALBUTEROL (DUONEB) 0.5-2.5 (3) MG/3ML SOLN    Take  3 mLs by nebulization every 6 (six) hours as needed.   LEVOTHYROXINE (SYNTHROID, LEVOTHROID) 100 MCG TABLET    Take 100 mcg by mouth daily before breakfast.   METFORMIN (GLUCOPHAGE) 500 MG TABLET    Take 500 mg by mouth 2 (two) times daily with a meal.   NON FORMULARY    Place 2 L into the nose continuous. OXYGEN   ONDANSETRON (ZOFRAN) 4 MG TABLET    Take 4 mg by mouth every 4 (four) hours as needed for nausea or vomiting.   PANTOPRAZOLE (PROTONIX) 40 MG TABLET    Take 40 mg by mouth daily.   POTASSIUM CHLORIDE SA (K-DUR,KLOR-CON) 20 MEQ  TABLET    Take 20 mEq by mouth 2 (two) times daily.   SOTALOL (BETAPACE) 160 MG TABLET    Take 160 mg by mouth 2 (two) times daily.    WARFARIN (COUMADIN) 1 MG TABLET    Take 6 mg by mouth daily at 6 PM.   Modified Medications   No medications on file  Discontinued Medications   ACETAMINOPHEN (TYLENOL) 500 MG TABLET    Take 1,000 mg by mouth every 6 (six) hours as needed.   BENZONATATE (TESSALON) 200 MG CAPSULE    Take 200 mg by mouth 3 (three) times daily as needed for cough.   GLIPIZIDE (GLUCOTROL XL) 10 MG 24 HR TABLET    Take 10 mg by mouth 2 (two) times daily with a meal.   OMEPRAZOLE (PRILOSEC) 40 MG CAPSULE    Take 40 mg by mouth daily.     Physical Exam: Filed Vitals:   12/13/13 1009  BP: 137/86  Pulse: 65  Temp: 97.7 F (36.5 C)  Resp: 18  Weight: 148 lb 3.2 oz (67.223 kg)  SpO2: 94%    General- elderly female in no acute distress Head- atraumatic, normocephalic Eyes- no pallor, no icterus, no discharge Neck- no lymphadenopathy Throat- moist mucus membrane Cardiovascular- irregular heart rate, no murmurs Respiratory- bilateral poor air entry, no wheeze, no rhonchi, no crackles, no use of accessory muscles Abdomen- bowel sounds present, soft, non tender Musculoskeletal- able to move all 4 extremities, trace leg edema, generalized weakness, using wheelchair Neurological- no focal deficit, alert and oriented to person, place and time Skin- warm and dry, chronic venous stasis changes in both legs Psychiatry-  normal mood and affect   Labs reviewed: Basic Metabolic Panel:  Recent Labs  06/11/13 09/21/13 09/22/13 09/23/13 09/24/13  NA 129*  --  130* 131* 131*  K 4.9  --  4.2 4.2 4.1  BUN 23*  --  18 17 17   CREATININE 0.7  --  1.0 0.7 0.7  MG  --  2.0  --   --   --    CBC:  Recent Labs  09/20/13 09/21/13  WBC 6.8 0.5  HGB 11.4* 10.3*  HCT 35* 32*  PLT 176 131*   12/08/13 na 135, k 4.5, bun 20, cr 1.23, glu 123, bnp 4915, lft wnl, trop 0.06, wbc 6.2, hb  11.3, plt 169  Radiological Exams:  12/08/13 cxr- interstitial infiltrate most prominent over right lower and middle lobe   Assessment/Plan  Generalized weakness From deconditioning in setting of pneumonia and chf exacerbation. Will have her work with physical therapy and occupational therapy team to help with gait training and muscle strengthening exercises.fall precautions. Skin care. Encourage to be out of bed. Monitor oral intake  Pneumonia To complete her course of ceftin. Monitor breathing status and bowel movement. Continue duonebs  for now  CHF Recent acute exacerbation. Monitor daily weight. Continue oxygen with coreg and lasix 40 mg daily. Monitor bmp. Pt is on baby aspirin  Copd Continue o2 to keep o2 sat > 90 at rest with duonebs.   supratherapeutic inr inr today 4.7. Hold coumadin of 6 mg today and recheck inr 12/14/13. No signs of bleeding. Monitor for this.   afib Stable, s/p pacemaker, continue sotalol 160 mg bid , coreg 6.25 mg bid and coumadin with goal inr 2.5-3.5 with her valve replacement history. Holding coumadin today- see above  Hypokalemia Continue kcl supplement  Hypothyroidism continue synthroid 100 mcg daily   Dm Monitor cbg, continue metformin 500 mg twice daily and SSI insulin  Gerd continue protonix 40 mg daily    Family/ staff Communication: reviewed care plan with patient and nursing supervisor   Goals of care: short term rehabilitation   Labs/tests ordered: cbc, cmp    Blanchie Serve, MD  [facility] [phone] (Monday-Friday 8 am - 5 pm) [phone] (afterhours)

## 2013-12-19 ENCOUNTER — Non-Acute Institutional Stay (SKILLED_NURSING_FACILITY): Payer: Commercial Managed Care - HMO | Admitting: Adult Health

## 2013-12-19 DIAGNOSIS — R5381 Other malaise: Secondary | ICD-10-CM

## 2013-12-19 DIAGNOSIS — Z7901 Long term (current) use of anticoagulants: Secondary | ICD-10-CM

## 2013-12-19 DIAGNOSIS — R531 Weakness: Secondary | ICD-10-CM

## 2013-12-19 DIAGNOSIS — E1159 Type 2 diabetes mellitus with other circulatory complications: Secondary | ICD-10-CM

## 2013-12-19 DIAGNOSIS — J9620 Acute and chronic respiratory failure, unspecified whether with hypoxia or hypercapnia: Secondary | ICD-10-CM

## 2013-12-19 DIAGNOSIS — J441 Chronic obstructive pulmonary disease with (acute) exacerbation: Secondary | ICD-10-CM

## 2013-12-19 DIAGNOSIS — I5043 Acute on chronic combined systolic (congestive) and diastolic (congestive) heart failure: Secondary | ICD-10-CM

## 2013-12-19 DIAGNOSIS — R5383 Other fatigue: Secondary | ICD-10-CM

## 2013-12-25 ENCOUNTER — Inpatient Hospital Stay: Payer: Self-pay | Admitting: Internal Medicine

## 2013-12-25 LAB — CBC
HCT: 35.7 % (ref 35.0–47.0)
HGB: 11 g/dL — ABNORMAL LOW (ref 12.0–16.0)
MCH: 26.5 pg (ref 26.0–34.0)
MCHC: 30.8 g/dL — ABNORMAL LOW (ref 32.0–36.0)
MCV: 86 fL (ref 80–100)
Platelet: 223 10*3/uL (ref 150–440)
RBC: 4.15 10*6/uL (ref 3.80–5.20)
RDW: 16.1 % — ABNORMAL HIGH (ref 11.5–14.5)
WBC: 6.9 10*3/uL (ref 3.6–11.0)

## 2013-12-25 LAB — PRO B NATRIURETIC PEPTIDE: B-Type Natriuretic Peptide: 5950 pg/mL — ABNORMAL HIGH (ref 0–450)

## 2013-12-25 LAB — BASIC METABOLIC PANEL
ANION GAP: 5 — AB (ref 7–16)
Anion Gap: 4 — ABNORMAL LOW (ref 7–16)
Anion Gap: 6 — ABNORMAL LOW (ref 7–16)
BUN: 44 mg/dL — AB (ref 7–18)
BUN: 38 mg/dL — ABNORMAL HIGH (ref 7–18)
BUN: 38 mg/dL — AB (ref 7–18)
CALCIUM: 9.1 mg/dL (ref 8.5–10.1)
CHLORIDE: 101 mmol/L (ref 98–107)
CO2: 25 mmol/L (ref 21–32)
CO2: 25 mmol/L (ref 21–32)
CREATININE: 1.75 mg/dL — AB (ref 0.60–1.30)
Calcium, Total: 9 mg/dL (ref 8.5–10.1)
Calcium, Total: 9.1 mg/dL (ref 8.5–10.1)
Chloride: 101 mmol/L (ref 98–107)
Chloride: 101 mmol/L (ref 98–107)
Co2: 27 mmol/L (ref 21–32)
Creatinine: 1.68 mg/dL — ABNORMAL HIGH (ref 0.60–1.30)
Creatinine: 1.61 mg/dL — ABNORMAL HIGH (ref 0.60–1.30)
EGFR (African American): 32 — ABNORMAL LOW
EGFR (Non-African Amer.): 29 — ABNORMAL LOW
EGFR (Non-African Amer.): 30 — ABNORMAL LOW
GFR CALC AF AMER: 33 — AB
GFR CALC AF AMER: 35 — AB
GFR CALC NON AF AMER: 27 — AB
GLUCOSE: 198 mg/dL — AB (ref 65–99)
Glucose: 162 mg/dL — ABNORMAL HIGH (ref 65–99)
Glucose: 179 mg/dL — ABNORMAL HIGH (ref 65–99)
OSMOLALITY: 276 (ref 275–301)
OSMOLALITY: 277 (ref 275–301)
Osmolality: 282 (ref 275–301)
POTASSIUM: 5.5 mmol/L — AB (ref 3.5–5.1)
Potassium: 6.8 mmol/L (ref 3.5–5.1)
Potassium: 5.8 mmol/L — ABNORMAL HIGH (ref 3.5–5.1)
SODIUM: 131 mmol/L — AB (ref 136–145)
Sodium: 130 mmol/L — ABNORMAL LOW (ref 136–145)
Sodium: 134 mmol/L — ABNORMAL LOW (ref 136–145)

## 2013-12-25 LAB — CK TOTAL AND CKMB (NOT AT ARMC)
CK, Total: 51 U/L
CK-MB: 1.1 ng/mL (ref 0.5–3.6)

## 2013-12-25 LAB — TROPONIN I
Troponin-I: <0.02 ng/mL
Troponin-I: <0.02 ng/mL
Troponin-I: 0.02 ng/mL

## 2013-12-25 LAB — PROTIME-INR
INR: 10.9 — AB
Prothrombin Time: 81.3 secs — ABNORMAL HIGH (ref 11.5–14.7)

## 2013-12-25 LAB — DIGOXIN LEVEL: DIGOXIN: 0.1 ng/mL

## 2013-12-26 LAB — COMPREHENSIVE METABOLIC PANEL
ALBUMIN: 3.6 g/dL (ref 3.4–5.0)
ALT: 19 U/L
Alkaline Phosphatase: 122 U/L — ABNORMAL HIGH
Anion Gap: 5 — ABNORMAL LOW (ref 7–16)
BUN: 28 mg/dL — ABNORMAL HIGH (ref 7–18)
Bilirubin,Total: 0.9 mg/dL (ref 0.2–1.0)
CHLORIDE: 104 mmol/L (ref 98–107)
Calcium, Total: 8.7 mg/dL (ref 8.5–10.1)
Co2: 28 mmol/L (ref 21–32)
Creatinine: 1.27 mg/dL (ref 0.60–1.30)
EGFR (African American): 46 — ABNORMAL LOW
GFR CALC NON AF AMER: 40 — AB
Glucose: 102 mg/dL — ABNORMAL HIGH (ref 65–99)
Osmolality: 279 (ref 275–301)
Potassium: 4.7 mmol/L (ref 3.5–5.1)
SGOT(AST): 30 U/L (ref 15–37)
SODIUM: 137 mmol/L (ref 136–145)
Total Protein: 7 g/dL (ref 6.4–8.2)

## 2013-12-26 LAB — CBC WITH DIFFERENTIAL/PLATELET
BASOS ABS: 0.1 10*3/uL (ref 0.0–0.1)
Basophil %: 1 %
Eosinophil #: 0.2 10*3/uL (ref 0.0–0.7)
Eosinophil %: 4.5 %
HCT: 33.1 % — AB (ref 35.0–47.0)
HGB: 10.2 g/dL — ABNORMAL LOW (ref 12.0–16.0)
LYMPHS PCT: 15.7 %
Lymphocyte #: 0.8 10*3/uL — ABNORMAL LOW (ref 1.0–3.6)
MCH: 26.6 pg (ref 26.0–34.0)
MCHC: 30.9 g/dL — ABNORMAL LOW (ref 32.0–36.0)
MCV: 86 fL (ref 80–100)
MONO ABS: 0.8 x10 3/mm (ref 0.2–0.9)
MONOS PCT: 16.2 %
Neutrophil #: 3.1 10*3/uL (ref 1.4–6.5)
Neutrophil %: 62.6 %
Platelet: 149 10*3/uL — ABNORMAL LOW (ref 150–440)
RBC: 3.84 10*6/uL (ref 3.80–5.20)
RDW: 15.5 % — AB (ref 11.5–14.5)
WBC: 4.9 10*3/uL (ref 3.6–11.0)

## 2013-12-26 LAB — PROTIME-INR
INR: 1.7
Prothrombin Time: 19.7 secs — ABNORMAL HIGH (ref 11.5–14.7)

## 2013-12-27 LAB — CBC WITH DIFFERENTIAL/PLATELET
Basophil #: 0.1 10*3/uL (ref 0.0–0.1)
Basophil %: 1.1 %
EOS PCT: 4.8 %
Eosinophil #: 0.2 10*3/uL (ref 0.0–0.7)
HCT: 32.2 % — AB (ref 35.0–47.0)
HGB: 9.9 g/dL — AB (ref 12.0–16.0)
LYMPHS PCT: 15.8 %
Lymphocyte #: 0.8 10*3/uL — ABNORMAL LOW (ref 1.0–3.6)
MCH: 26.2 pg (ref 26.0–34.0)
MCHC: 30.6 g/dL — AB (ref 32.0–36.0)
MCV: 86 fL (ref 80–100)
Monocyte #: 0.8 x10 3/mm (ref 0.2–0.9)
Monocyte %: 16 %
NEUTROS ABS: 3.1 10*3/uL (ref 1.4–6.5)
Neutrophil %: 62.3 %
Platelet: 145 10*3/uL — ABNORMAL LOW (ref 150–440)
RBC: 3.76 10*6/uL — ABNORMAL LOW (ref 3.80–5.20)
RDW: 15.9 % — AB (ref 11.5–14.5)
WBC: 5 10*3/uL (ref 3.6–11.0)

## 2013-12-27 LAB — BASIC METABOLIC PANEL
ANION GAP: 6 — AB (ref 7–16)
BUN: 20 mg/dL — ABNORMAL HIGH (ref 7–18)
CHLORIDE: 103 mmol/L (ref 98–107)
CO2: 28 mmol/L (ref 21–32)
CREATININE: 0.78 mg/dL (ref 0.60–1.30)
Calcium, Total: 8.3 mg/dL — ABNORMAL LOW (ref 8.5–10.1)
EGFR (Non-African Amer.): >60
Glucose: 138 mg/dL — ABNORMAL HIGH (ref 65–99)
Osmolality: 279 (ref 275–301)
POTASSIUM: 4.4 mmol/L (ref 3.5–5.1)
Sodium: 137 mmol/L (ref 136–145)

## 2013-12-27 LAB — PROTIME-INR
INR: 1.5
PROTHROMBIN TIME: 17.4 s — AB (ref 11.5–14.7)

## 2013-12-28 ENCOUNTER — Encounter: Payer: Self-pay | Admitting: Internal Medicine

## 2013-12-28 LAB — BASIC METABOLIC PANEL
Anion Gap: 7 (ref 7–16)
BUN: 13 mg/dL (ref 7–18)
CALCIUM: 8 mg/dL — AB (ref 8.5–10.1)
CO2: 28 mmol/L (ref 21–32)
CREATININE: 0.64 mg/dL (ref 0.60–1.30)
Chloride: 101 mmol/L (ref 98–107)
EGFR (African American): >60
EGFR (Non-African Amer.): >60
GLUCOSE: 108 mg/dL — AB (ref 65–99)
Osmolality: 273 (ref 275–301)
Potassium: 4.2 mmol/L (ref 3.5–5.1)
Sodium: 136 mmol/L (ref 136–145)

## 2013-12-28 LAB — CBC WITH DIFFERENTIAL/PLATELET
Basophil #: 0.1 10*3/uL (ref 0.0–0.1)
Basophil %: 1.1 %
EOS ABS: 0.2 10*3/uL (ref 0.0–0.7)
EOS PCT: 4.9 %
HCT: 32.4 % — ABNORMAL LOW (ref 35.0–47.0)
HGB: 9.9 g/dL — ABNORMAL LOW (ref 12.0–16.0)
LYMPHS ABS: 0.8 10*3/uL — AB (ref 1.0–3.6)
Lymphocyte %: 16.6 %
MCH: 26.1 pg (ref 26.0–34.0)
MCHC: 30.6 g/dL — ABNORMAL LOW (ref 32.0–36.0)
MCV: 85 fL (ref 80–100)
Monocyte #: 0.9 x10 3/mm (ref 0.2–0.9)
Monocyte %: 18.2 %
NEUTROS ABS: 2.9 10*3/uL (ref 1.4–6.5)
Neutrophil %: 59.2 %
Platelet: 136 10*3/uL — ABNORMAL LOW (ref 150–440)
RBC: 3.79 10*6/uL — ABNORMAL LOW (ref 3.80–5.20)
RDW: 15.9 % — AB (ref 11.5–14.5)
WBC: 4.9 10*3/uL (ref 3.6–11.0)

## 2013-12-28 LAB — PROTIME-INR
INR: 1.5
Prothrombin Time: 17.9 secs — ABNORMAL HIGH (ref 11.5–14.7)

## 2013-12-30 NOTE — Progress Notes (Signed)
Patient ID: [name] Perkins, female   DOB: [birth year], 80 y.o.   MRN: [id number]     [facility]  Allergies  Allergen Reactions  . Contrast Media [Iodinated Diagnostic Agents]   . Erythromycin   . Parafon Forte Dsc [Chlorzoxazone]   . Penicillins   . Septra [Sulfamethoxazole-Tmp Ds]      Chief Complaint  Patient presents with  . Discharge Note    HPI:  She and her spouse would like for her to go home. We have had a prolonged discussion regarding how well she will do in her home environment. Therapy does have some reservations as well as myself. As I feel she would benefit from more therapy here; a longer stay to ensure that she has reached her maximum health potential. She and her husband would like for her to come home. They do understand the risks involved with this and do have help as well.    Past Medical History  Diagnosis Date  . CHF (congestive heart failure)   . Pleural effusion, right   . A-fib   . Thyroid disease   . Chronic rheumatic heart disease   . Diabetes mellitus without complication   . COPD (chronic obstructive pulmonary disease)   . Chronic respiratory failure   . Cardiomyopathy     ef 45%  . Closed left subtrochanteric femur fracture   . Bladder tumor     Past Surgical History  Procedure Laterality Date  . Pacemaker insertion    . Mitral valve replacement      St. Jude mechanical  . Left femur orif    . Bladder tumor excision      VITAL SIGNS BP 138/76  Pulse 70  Ht 5' (1.524 m)  Wt 151 lb 3.2 oz (68.584 kg)  BMI 29.53 kg/m2  SpO2 97%   Patient's Medications  New Prescriptions   No medications on file  Previous Medications   ASPIRIN 81 MG TABLET    Take 81 mg by mouth daily.   CARVEDILOL (COREG) 6.25 MG TABLET    Take 6.25 mg by mouth 2 (two) times daily with a meal.   CEFUROXIME (CEFTIN) 500 MG TABLET    Take 500 mg by mouth 2 (two) times daily with a meal. Until 12/21/13 for pneumonia   FUROSEMIDE (LASIX) 40 MG TABLET    Take 40  mg by mouth daily. Take 80 mg in the AM and 40 mg in the PM   IPRATROPIUM-ALBUTEROL (DUONEB) 0.5-2.5 (3) MG/3ML SOLN    Take 3 mLs by nebulization every 6 (six) hours as needed.   LEVOTHYROXINE (SYNTHROID, LEVOTHROID) 100 MCG TABLET    Take 100 mcg by mouth daily before breakfast.   METFORMIN (GLUCOPHAGE) 500 MG TABLET    Take 500 mg by mouth 2 (two) times daily with a meal.   NON FORMULARY    Place 2 L into the nose continuous. OXYGEN   ONDANSETRON (ZOFRAN) 4 MG TABLET    Take 4 mg by mouth every 4 (four) hours as needed for nausea or vomiting.   PANTOPRAZOLE (PROTONIX) 40 MG TABLET    Take 40 mg by mouth daily.   POTASSIUM CHLORIDE SA (K-DUR,KLOR-CON) 20 MEQ TABLET    Take 20 mEq by mouth 2 (two) times daily.   SOTALOL (BETAPACE) 160 MG TABLET    Take 160 mg by mouth 2 (two) times daily.    WARFARIN (COUMADIN) 1 MG TABLET    Take 6 mg by mouth daily at 6 PM.  Modified Medications   No medications on file  Discontinued Medications   No medications on file    SIGNIFICANT DIAGNOSTIC EXAMS    05-25-13: chest x-ray: 1. Mild chf with stable moderate cardiomegaly and minimal to mild diffuse interstitial pulmonary edema. 2. Moderately large right pleuarl effusion and associated dense passive atelectasis in the right lower lobe. \  05-26-13: right lower extremity doppler: 1. Negative for dvt of the right lower extremity. 2. Abnormal waveforms throughout the right lower venous system, favor tricuspid valve regurgitation. 5 x 2 x 2 cm cyst in the popliteal fossa, usually baker's cyst.   05-29-13: chest x-ray: stable bilateral pleural effusions  09-20-13: chest x-ray: 1. Cardiomegaly and pulmonary vascular congestion without overt edema. Moderate right pleural effusion and associated right basilar atelectasis    09-21-13: 2-d echo: ef 50-55%; low normal global left ventricular systolic function. Moderate left ventricular hypertrophy. Severe tricuspid regurgitation. Mild to moderate mitral valve  regurgitation. Severely increased left ventricular posterior wall thickness   09-23-13: chest x-ray; small right pleural effusion. Mild pulmonary vascular congestion without overt chf       LABS REVIEWED:   05-25-13 wbc 5.4; hgb 12.5; hct 38.4; mcv 89; plt 135; glucose 101; bun 17;creat 0.77; k+4.5; na++131 BNP 3717   U/a: neg  05-28-13; glucose 114; bun 10; creat 0.76; k+3.9; na++131 05-29-13: glucose 109; bun 11; creat 0.76; k+4.0; na++130 05-30-13: glucose 112; bun 12; creat 0.75; k+3.9; na++131  06-11-13: glucose 86; bun 23; creat 0.7; k+ 4.9; na++129  09-20-13: wbc 6.8; hgb 11.4; hct 34.9; mcv 89; plt 176 09-21-13: wbc 5.4; hgb 10.3; hct 31.5; mcv 89; plt 131; mag 2.0 09-22-13: glucose 148; bun 18; creat 1.03; k+4.2; na++130 09-23-13: glucose 105; bun 17; creat 0.73; k+4.2; na++131 09-24-13: glucose 115; bun 17;p creat 0.74; k+ 4.1; na++131        Review of Systems  Constitutional: negative for fatigue  Eyes: Negative for blurred vision.  Respiratory: negative for cough or shortness of breath    Cardiovascular: Negative for chest pain, palpitations and leg swelling.  Gastrointestinal: Negative for heartburn, abdominal pain and constipation.  Musculoskeletal: Negative for joint pain and myalgias.  Skin: Negative.   Neurological: Negative for dizziness, weakness and headaches.  Psychiatric/Behavioral: Negative for depression. The patient does not have insomnia.       Physical Exam  Constitutional: She is oriented to person, place, and time. No distress.  frail  Neck: Neck supple. No JVD present.  Cardiovascular: Normal rate and intact distal pulses.  heart rate irregular  Respiratory: Effort normal. No respiratory distress. She has no wheezes  GI: Soft. Bowel sounds are normal. She exhibits no distension. There is no tenderness.  Musculoskeletal: Normal range of motion. She exhibits no edema. kyphosis has trace pedal edema  Lymphadenopathy:    She has no cervical adenopathy.    Neurological: She is alert and oriented to person, place, and time.  Skin: Skin is warm and dry. She is not diaphoretic.  Lower extremities discolored due to pvd  Psychiatric: She has a normal mood and affect.     ASSESSMENT/ PLAN:  Will discharge her to home with home health for pt/ot/rn to continue to improve up on her strength; gait and independence. Will have home health follow her for heart failure and chronic respiratory disease management as well. She has a rollator; but will need a front wheel walker; this she will obtain. She is due her an inr on 12-27-13. She has a follow up appointment  with Dr. Doy Hutching on 01-04-14 at 4 pm. She has had a 30 days supply of her prescriptions written for her.    Time spent with patient 50 minutes.     Ok Edwards NP [facility]  Contact [phone] Monday through Friday 8am- 5pm  After hours call [phone]

## 2014-01-03 LAB — URINALYSIS, COMPLETE
BACTERIA: NONE SEEN
BILIRUBIN, UR: NEGATIVE
BLOOD: NEGATIVE
GLUCOSE, UR: NEGATIVE mg/dL (ref 0–75)
Ketone: NEGATIVE
NITRITE: NEGATIVE
Ph: 6 (ref 4.5–8.0)
Protein: 30
Specific Gravity: 1.015 (ref 1.003–1.030)

## 2014-01-03 LAB — CBC WITH DIFFERENTIAL/PLATELET
BASOS ABS: 0.1 10*3/uL (ref 0.0–0.1)
BASOS PCT: 0.8 %
EOS ABS: 0.2 10*3/uL (ref 0.0–0.7)
Eosinophil %: 3.6 %
HCT: 32.2 % — AB (ref 35.0–47.0)
HGB: 10 g/dL — ABNORMAL LOW (ref 12.0–16.0)
LYMPHS PCT: 10.6 %
Lymphocyte #: 0.6 10*3/uL — ABNORMAL LOW (ref 1.0–3.6)
MCH: 26.2 pg (ref 26.0–34.0)
MCHC: 31.1 g/dL — AB (ref 32.0–36.0)
MCV: 84 fL (ref 80–100)
MONOS PCT: 14.3 %
Monocyte #: 0.9 x10 3/mm (ref 0.2–0.9)
NEUTROS ABS: 4.3 10*3/uL (ref 1.4–6.5)
Neutrophil %: 70.7 %
Platelet: 119 10*3/uL — ABNORMAL LOW (ref 150–440)
RBC: 3.82 10*6/uL (ref 3.80–5.20)
RDW: 16.7 % — ABNORMAL HIGH (ref 11.5–14.5)
WBC: 6 10*3/uL (ref 3.6–11.0)

## 2014-01-03 LAB — BASIC METABOLIC PANEL
Anion Gap: 5 — ABNORMAL LOW (ref 7–16)
BUN: 9 mg/dL (ref 7–18)
CALCIUM: 8.6 mg/dL (ref 8.5–10.1)
CHLORIDE: 97 mmol/L — AB (ref 98–107)
Co2: 32 mmol/L (ref 21–32)
Creatinine: 0.61 mg/dL (ref 0.60–1.30)
EGFR (African American): >60
EGFR (Non-African Amer.): >60
Glucose: 120 mg/dL — ABNORMAL HIGH (ref 65–99)
Osmolality: 268 (ref 275–301)
Potassium: 3.3 mmol/L — ABNORMAL LOW (ref 3.5–5.1)
SODIUM: 134 mmol/L — AB (ref 136–145)

## 2014-01-03 LAB — PROTIME-INR
INR: 1.9
PROTHROMBIN TIME: 21.4 s — AB (ref 11.5–14.7)

## 2014-01-03 LAB — DIGOXIN LEVEL: Digoxin: 1.73 ng/mL

## 2014-01-04 LAB — URINE CULTURE

## 2014-01-08 LAB — PROTIME-INR
INR: 2.1
Prothrombin Time: 23.4 secs — ABNORMAL HIGH (ref 11.5–14.7)

## 2014-01-24 ENCOUNTER — Encounter: Payer: Self-pay | Admitting: Internal Medicine

## 2014-07-30 ENCOUNTER — Ambulatory Visit: Payer: Self-pay | Admitting: *Deleted

## 2014-08-06 ENCOUNTER — Other Ambulatory Visit: Payer: Self-pay | Admitting: *Deleted

## 2014-08-07 NOTE — Patient Outreach (Signed)
[facility]) Care Management  [place]  08/07/2014   [name] Perkins [birth year] [id number]  Subjective: visit done 4/12- Spouse states  HHRN came 4/11 to  apply dressings on pt's lower legs,reports no sores, to come  again 4/21 and then pt to see vein MD 4/28.  Spouse states with f/u with MD, will see if pt can start wearingSupport stockings.  Pt states she is to have lab work done tomorrow to which spouse states is for diabetic MD, pt to f/u with MD 4/20.  Spouse states HH PT continues to come once a week.    Objective: Lungs clear, +2 edema noted on top of both feet, above unaboot dressings.  Unaboot dressings intact.    Filed Vitals:   08/06/14 1600  BP: 128/90  Pulse: 71  Resp: 24                         Current Medications:  F/u call to spouse 4/13 for med review  Meds ordered this encounter  Medications  . furosemide (LASIX) 40 MG tablet    Sig: Take 40 mg by mouth. 4/12- spouse reports regiment for pt taking Lasix- 80 mg one day, then 40 mg the next day,then 40 mg next day, then 20 mg the next day. Repeat cycle.  . diltiazem (TIAZAC) 180 MG 24 hr capsule    Sig: Take 180 mg by mouth 2 (two) times daily.  Marland Kitchen glipiZIDE (GLUCOTROL XL) 10 MG 24 hr tablet    Sig: Take 10 mg by mouth 2 (two) times daily.  Marland Kitchen losartan (COZAAR) 25 MG tablet    Sig: Take 25 mg by mouth at bedtime.  Marland Kitchen omeprazole (PRILOSEC) 40 MG capsule    Sig: Take 40 mg by mouth daily.  . potassium chloride (K-DUR) 10 MEQ tablet    Sig: Take 10 mEq by mouth 2 (two) times daily.  Marland Kitchen warfarin (COUMADIN) 2.5 MG tablet    Sig: Take 2.5 mg by mouth every other day.  . warfarin (COUMADIN) 5 MG tablet    Sig: Take 5 mg by mouth every other day. Alternate with 2.5 mg tablets     Falls: no recent falls.   Assessment:  DM: pt not checking blood sugars on a regular basis,today during home visit 136 (3 hours after meal).    Edema to lower legs:  Pt to continue with unaboots, HHRN applying weekly.    Plan:  Pt to f/u with Dr. Elisabeth Cara 4/21, encouraged pt to check sugars more often.            Pt to elevate bilateral legs as much as possible, decrease edema.            Plan to discharge pt from RN CM services, goals not met.             Plan to inform MD of discharge.        Zara Chess.   [facility]  [phone]

## 2014-08-09 ENCOUNTER — Encounter: Payer: Self-pay | Admitting: *Deleted

## 2014-08-16 NOTE — Op Note (Signed)
PATIENT NAME:  [name] Perkins, [name] Perkins MR#:  [id number] DATE OF BIRTH:  1931-05-16  DATE OF PROCEDURE:  10/26/2012  PRIMARY CARE PHYSICIAN: Cheral Marker. Ola Spurr, MD  PREPROCEDURE DIAGNOSIS: Sick sinus syndrome with elective replacement indication.   PROCEDURE: Dual-chamber pacemaker generator change-out.   POSTPROCEDURE DIAGNOSIS: Intermittent ventricular pacing.   INDICATION: The patient is a 81 year old female with history of dual-chamber pacemaker implantation on 05/31/2005. Recent pacemaker interrogation showed the pacemaker was at elective replacement indication with a slow ventricular escape rhythm. Procedure, risks, benefits and alternatives of pacemaker generator change-out were explained to the patient, and informed written consent was obtained.   DESCRIPTION OF PROCEDURE: She was brought to the operating room in a fasting state. The left pectoral region was prepped and draped in the usual sterile manner. Anesthesia was obtained with 1% Xylocaine locally. A 6 cm incision was performed over the left pectoral region. The old pacemaker generator was retrieved by electrocautery and blunt dissection. The leads were disconnected and the pacemaker generator interrogated and then connected to a new pacemaker generator ([place]). The pacemaker pocket was irrigated with gentamicin solution. The new pacemaker generator was positioned into the pocket. The pocket was closed with 2-0 and 4-0 Vicryl, respectively. Steri-Strips and pressure dressing were applied.   ____________________________ Isaias Cowman, MD ap:OSi D: 10/26/2012 11:59:07 ET T: 10/26/2012 12:42:32 ET JOB#: [id number]  cc: Isaias Cowman, MD, <Dictator> Isaias Cowman MD ELECTRONICALLY SIGNED 11/14/2012 12:28

## 2014-08-16 NOTE — H&P (Signed)
PATIENT NAME:  [name] Perkins, [name] Perkins MR#:  [id number] DATE OF BIRTH:  [birth year]  DATE OF ADMISSION:  02/22/2013  PRIMARY CARE PHYSICIAN: Dr. Ola Spurr.  CHIEF COMPLAINT: Nausea and vomiting early hours of this morning.   HISTORY OF PRESENT ILLNESS: [name] Perkins is a 81 year old Caucasian female with past medical history of chronic A fib., chronic congestive heart failure and chronic right pleural effusion along with history of diabetes, who comes to the Emergency Room after she started having nausea and vomiting several times at home in the early hours of the morning. The patient says she and her husband went to [employer] for dinner yesterday. She ate fried chicken steak and tossed salad along with some dessert. She went to bed at 9:30; however, woke up early hours of the morning not feeling well and started having several bouts of vomiting and threw up some undigested food. She feels really weak and started having some shortness of breath along with nausea and vomiting. She came to the Emergency Room. She appears clinically mildly dehydrated. The patient was found to have moderate sized right pleural effusion, which, according to the husband, she has had it before, never had thoracentesis in the past. The patient wears oxygen during nighttime and p.r.n. during the daytime at home. She is otherwise hemodynamically stable and being admitted for dehydration from nausea and vomiting, likely food poisoning.   PAST MEDICAL HISTORY:  1.  Chronic A. fib. 2.  Hypothyroidism.  3.  Rheumatic heart disease along with mitral valve replacement. The patient has a mechanical valve placed, on Coumadin.  4.  Type 2 diabetes.  5.  Status post pacemaker.  6.  History of congestive heart failure.  7.  History of COPD, on home oxygen mainly during the night time.  8.  History of right pleural effusion, chronic. The patient's husband reports the patient was scheduled to have thoracentesis in the remote past; however,  it was canceled after her Lasix dose was increased and the patient was feeling better.  9.  Cardiomyopathy with ejection fraction of 45% by echo in the past.   ALLERGIES: PENICILLIN, SEPTRA, ERYTHROMYCIN AND PARAFON FORTE.   HOME MEDICATIONS:  1.  Cardizem CD 180 mg p.o. daily.  2.  Digoxin 250 mcg p.o. daily.  3.  Lasix 20 mg b.i.d.  4.  Glipizide 5 mg b.i.d.  5.  Levoxyl 100 mcg p.o. daily.  6.  Losartan 25 mg p.o. daily.  7.  Metformin 500 mg b.i.d.  8.  K-Dur 10 mEq  p.o. b.i.d.  9.  Sotalol 160 mg b.i.d.  10. Tylenol 325 mg 1 to 2 p.o. p.r.n. for pain.  11. Warfarin 5 mg at bedtime.   SOCIAL HISTORY:  Married and lives at home with her husband. Denies any tobacco use, but has secondhand smoking with her husband. No alcohol use.   FAMILY HISTORY: Diabetes and heart disease.   PAST SURGICAL HISTORY:  1.  Mechanical mitral valve replacement due to rheumatic heart disease about 30 years ago.  2.  Pacemaker placement.  3.  Tonsillectomy.  4.  Left fracture repair.  5.  Bladder tumor surgery.   REVIEW OF SYSTEMS: CONSTITUTIONAL: Positive for fatigue. No fever. Positive for weakness. EYES: No blurred or double vision, glaucoma or cataracts. ENT: No tinnitus, ear pain, hearing loss or epistaxis. CARDIOVASCULAR: No chest pain, orthopnea or edema. Positive for hypertension. RESPIRATORY: No cough, wheeze or hemoptysis. Positive for shortness of breath, which appears due to her chronic respiratory failure  on home oxygen. GASTROINTESTINAL: Positive for nausea and vomiting. No diarrhea. No abdominal pain or melena. NEUROLOGIC: No CVA, TIA, tremors or vertigo. GENITOURINARY: No dysuria, hematuria, or frequency. ENDOCRINE: No polyuria or nocturia. Positive for hypothyroidism.  MUSCULOSKELETAL: Positive for arthritis and back pain. No swelling of joints. HEMATOLOGY: No anemia, easy bruising or bleeding disorder. PSYCHIATRIC: No anxiety, depression or bipolar disorder. All other systems reviewed are  negative.   PHYSICAL EXAM:  GENERAL: The patient is awake, alert, oriented x 3 not in acute distress.  VITAL SIGNS: Afebrile, pulse is 63, blood pressure is 167/81, pulse oximetry is 98% on 2 L. HEENT: Atraumatic, normocephalic. Pupils are PERRLA. EOMI intact. Oral mucosa is dry.  NECK: Supple. No JVD. No carotid bruits.  RESPIRAOTYR: Clear to auscultation bilaterally. Decreased breath sounds in the bases. No murmur. No wheezing or crackles heard.  CARDIOVASCULAR: Both heart sounds are normal. Rate and rhythm regular. PMI not lateralized. Chest nontender.  EXTREMITIES: Good pedal pulses and good femoral pulses. No lower extremity edema. ABDOMEH: Soft, benign and nontender. No organomegaly. Positive bowel sounds. NEUROLOGIC: Grossly intact cranial nerves II through XII. No motor or sensory deficits. PSYCHIATRIC: The patient is awake, alert, oriented x 3.  SKIN: Warm and dry.   LABORATORY DATA: UA negative for UTI. Basic metabolic panel within normal limits. Lipase 142. Comprehensive metabolic panel within normal limits except glucose of 213. Sodium is 131 and chloride of 95.   ASSESSMENT: A 81 year old, [name] Perkins, with history of chronic atrial fibrillation with status post pacemaker, type 2 diabetes, history of mitral valve mechanical replacement, on Coumadin along with history of hypertension, comes in to the Emergency Room after she started having nausea and vomiting. The patient reports eating at [employer] last night. She vomited some undigested food throughout the night. Currently, she appears to be weak and clinically dehydrated. She is being admitted with:  1.  Acute gastroenteritis along with history of nausea and vomiting, suspected due to food poisoning. The patient will be admitted on the medical floor. We will continue intravenous fluids cautiously on given her history of cardiomyopathy and congestive heart failure. We will monitor I's and O's. We will start the patient on  full liquid diet and advance as tolerated.  2.  Chronic atrial fibrillation. Will continue diltiazem, sotalol and digoxin.  3.  History of mitral valve replacement with St. Jude's valve. Will continue Coumadin. Monitor PT and INR.  4.  Type 2 diabetes. Since the patient is able to tolerate crackers and oral fluids, we will advance her diet as tolerated and continue her metformin and glipizide. I will place the patient also on low-dose sliding scale insulin.  5.  Hypertension. We will resume losartan. The patient is already on digoxin.  6.  Hypothyroidism, on Synthroid.  7.  Deep venous thrombosis prophylaxis. The patient is already on Coumadin.   TIME SPENT: 50 minutes.   ____________________________ Hart Rochester Posey Pronto, MD sap:aw D: 02/22/2013 08:10:12 ET T: 02/22/2013 09:20:03 ET JOB#: [id number]  cc: Wilmore Holsomback A. Posey Pronto, MD, <Dictator> Cheral Marker. Ola Spurr, MD Ilda Basset MD ELECTRONICALLY SIGNED 03/01/2013 13:27

## 2014-08-17 NOTE — Discharge Summary (Signed)
PATIENT NAME:  [name] Perkins, [name] Perkins MR#:  [id number] DATE OF BIRTH:  02-Aug-1931  DATE OF ADMISSION:  09/20/2013 DATE OF DISCHARGE:  09/24/2013  DISCHARGE DIAGNOSES:  1.  Chronic obstructive pulmonary disease exacerbation, acute on chronic with respiratory failure.  2.  Congestive heart failure, acute on chronic diastolic.  3.  Atrial fibrillation.  4.  Mitral valve replacement with chronic anticoagulation.   HISTORY OF PRESENT ILLNESS: Please see initial history and physical. This is a 81 year old with CHF, diastolic, atrial fibrillation, mitral valve replacement on anticoagulation and chronic COPD on O2, who was admitted with increased shortness or breath and found to have likely combined diastolic and systolic acute on chronic heart failure. She was seen by cardiology and diuresed. She had an echocardiogram done that showed ejection fraction of 50% to 55%. She also has a history of atrial fibrillation and this was rate controlled with sotalol and Coumadin. Per her mechanical valve, she was maintained on Coumadin. For her diabetes, we initially held her metformin in the setting of decompensated congestive heart failure with diuresis, but this has been restarted as well as outpatient glipizide. For her COPD, she was continued on DuoNeb. She did have some hyponatremia and this was followed as she was diuresed.   DISCHARGE MEDICATIONS: Please see [facility] physician discharge summary, which lists her medications.   DISCHARGE FOLLOWUP: The patient will follow up with Dr. Ola Spurr within 1 to 2 weeks as well as with cardiology.   LABORATORY DATA: On the day of discharge, include a renal function creatinine 0.74, sodium was 130, glucose was 115, bicarbonate was 35. Troponins were negative on admission. INR at discharge was 3.1. Echocardiogram was done and showed an ejection fraction of 50% to 55% with valvular dysfunction.  CODE STATUS: Full code.   DISCHARGE DISPOSITION: This patient will be discharged to  rehab. She will follow up with Dr. Ola Spurr in 1 to 2 weeks.   TIME SPENT: This discharge took 40 minutes.  ____________________________ Cheral Marker. Ola Spurr, MD dpf:aw D: 09/24/2013 14:22:03 ET T: 09/24/2013 14:28:35 ET JOB#: [id number]  cc: Cheral Marker. Ola Spurr, MD, <Dictator> DAVID Ola Spurr MD ELECTRONICALLY SIGNED 09/24/2013 16:30

## 2014-08-17 NOTE — H&P (Signed)
PATIENT NAME:  [name] Perkins, BURRILL MR#:  [id number] DATE OF BIRTH:  1932-04-04  DATE OF ADMISSION:  12/08/2013  REFERRING PHYSICIAN: Physician Assistant, Sam Bullard.   PRIMARY CARE PHYSICIAN: Dr. Fulton Reek, [facility]   CARDIOLOGIST: Dr. Saralyn Pilar, [facility].   CHIEF COMPLAINT: Shortness of breath.   HISTORY AND PHYSIAL: A 81 year old Caucasian female with past medical history of congestive heart failure with diastolic with valvular dysfunction with ejection fraction of 50 to 55%, COPD with chronic respiratory failure on 2-3 liters nasal cannula at baseline and is presenting with shortness of breath. Describes gradual worsening of shortness of breath,  originally as dyspnea on exertion now having symptoms at rest. Symptoms actually began back in May 2015 after leaving rehab however, progressively worsened to the point where she denies  short of breath especially over the last 2 to 3 days. She also describes having orthopnea and bilateral lower extremity edema worse in the right leg, cough which was nonproductive. She, however, denies any chest pain, fevers, chills, further symptomatology other than fatigue and weakness.  REVIEW OF SYSTEMS:  CONSTITUTIONAL: Denies fever. Positive for fatigue, weakness.  EYES: Denies blurred vision, double vision, double vision or eye pain.  EARS, NOSE, THROAT: Denies tinnitus, ear pain, hearing loss.  RESPIRATORY: Positive for cough and shortness of breath as described above. Denies any wheezing.  CARDIOVASCULAR: Denies chest pain, palpitations. Positive for edema as described above.  GASTROINTESTINAL: Positive for nausea. Denies vomiting, diarrhea, abdominal pain. She denies dysuria or hematuria.  ENDOCRINE: Denies nocturia or thyroid problems.  HEMATOLOGIC AND LYMPHATIC: Denies easy bruising, bleeding.  SKIN: Denies rashes or lesions.  MUSCULOSKELETAL: Denies pain in neck, back, shoulder, knees, hips or arthritic symptoms.  NEUROLOGIC: Denies  paralysis or paresthesias.  PSYCHIATRIC: Denies anxiety or depressive symptoms.  Otherwise, full review of systems performed by me is negative.   PAST MEDICAL HISTORY: Chronic obstructive pulmonary disease with chronic respiratory failure to 2-3 liters nasal cannula at baseline, history of atrial fibrillation, congestive heart failure, ejection fraction of 50 to 55% with valvular dysfunction mechanical mitral valve replacement as well as type 2 diabetes non-insulin-requiring.   SOCIAL HISTORY: Denies any alcohol, tobacco, or drug usage. Uses a cane for ambulation.   FAMILY HISTORY: Positive for diabetes, as well as coronary artery disease.   ALLERGIES: CONTRAST DYE, ERYTHROMYCIN, PARAFON FORTE, PENICILLIN AND SEPTRA.   HOME MEDICATIONS: Include Tylenol 325 mg 1 to 2 tabs as needed for sleep or for pain, sotalol 160 mg p.o. b.i.d. warfarin 5 mg p.o. q. daily, which is a recent dose adjustment, glipizide 10 mg p.o. b.i.d., metformin 500 mg p.o. b.i.d., Vancenase 200 mg p.o. 3 times daily as needed for cough, Coreg 6.25 mg p.o. b.i.d., Lasix 80 mg p.o. q. daily, potassium 20 mEq p.o. b.i.d., Prilosec 40 mg p.o. daily, levothyroxine 100 mcg p.o. q. daily.   PHYSICAL EXAMINATION:  VITAL SIGNS: Temperature 98.7, heart rate 64, respirations 18, blood pressure 132/64, saturating 92% on supplemental O2. Weight 64.4 kg, BMI 24.4.  GENERAL: Chronically ill-appearing Caucasian female, currently in no acute distress.  HEAD: Normocephalic, atraumatic.  EYES: Pupils equal, round, and reactive to light. Extraocular muscles intact. No scleral icterus.  MOUTH: Most mucosal membrane. Dentition intact. No abscess noted.  EAR, NOSE, THROAT: Clear without exudates. No external lesions.  NECK: Supple. No thyromegaly. No nodules.  PULMONARY: Grossly diminished breath sounds throughout all lung fields and then with some associated crackle in the right lower lobe. No wheezes noted. No use of accessory muscles.  Poor   respiratory effort.  CHEST: Nontender to palpation.  CARDIOVASCULAR: S1, S2, regular rate and rhythm with a loud component of S1.  No appreciable murmur at this time. No rubs, or gallops. There is 1 to 2+ edema of lower extremities bilaterally more prominent on the right lower extremity. Pedal pulses 2+ bilaterally.  GASTROINTESTINAL: Soft, nontender, nondistended. No masses. Positive bowel sounds. No hepatosplenomegaly.  MUSCULOSKELETAL: No swelling, clubbing. Positive for edema as described above. Range of motion full in all extremities.  NEUROLOGIC: Cranial nerves II through XII intact. No gross focal neurological deficits. Sensation intact. Reflexes intact.  SKIN: Discoloration of bilateral lower extremities indicative of chronic edema; however, no further lesions, rashes, or cyanosis. Skin warm, dry. Turgor intact.  PSYCHIATRIC: Mood and affect within normal limits. The patient is awake alert, oriented x 3. Insight and judgment intact.   LABORATORY DATA: EKG performed reveals V paced rhythm. Chest x-ray performed reveals a mechanical mitral valve, permanent pacemaker in place as well as interstitial infiltrate most prominent over the right lower and right middle lobe greater than prior examinations. Remainder of laboratory data: Sodium 135, potassium 4.5, chloride 100, bicarbonate 30, BUN 20, creatinine 1.23, glucose 123. BNP 4915. LFTs within normal limits. Troponin I 0.06. WBC 6.2, hemoglobin 11.3, platelets of 169. INR 1.4.  ASSESSMENT AND PLAN: A 81 year old Caucasian female with history of congestive heart failure, chronic obstructive pulmonary disease and chronic respiratory failure, presenting with shortness of breath.   1. Acute on chronic diastolic congestive heart failure with valvular dysfunction admitted to telemetry, monitor ins and outs, urine output and renal function as well as daily weights. Continue diuresis with Lasix 80 mg IV daily and trend cardiac enzymes and consult  cardiology. She follows with Dr. Saralyn Pilar of [facility]. Of note, she had a transthoracic echocardiogram performed back in May of 2015 revealing ejection fraction of 50 to 55% with severe tricuspid regurgitation and moderate mitral regurgitation. We will provide DuoNeb treatments q. 4 hours supplement O2 to keep oxygen saturation greater than 92%, incentive spirometry, as well as consult for congestive heart failure clinic.  2. Elevated troponin. We will trend cardiac enzymes. Initiate aspirin and statin therapy. Suspect this most likely demand ischemia given above. If elevates we will start heparin drip.  3. Subtherapeutic INR. The patient with a mechanical mitral valve as well as paroxysmal, atrial fibrillation, we will start heparin drip and continue warfarin until therapeutic.  4. Type 2 diabetes. Hold p.o. agents. Add insulin sliding scale with q. 6 hour Accu-Cheks.  5. Chronic obstructive pulmonary disease, not in acute exacerbation: Continue with supplemental O2 as well as DuoNeb treatments.  6. Venous thromboembolism prophylaxis. She will be placed on heparin drip and we will continue warfarin until therapeutic.   CODE STATUS: The patient is full code.   TIME SPENT: 45 minutes.    ____________________________ Aaron Mose. Hower, MD dkh:JT D: 12/08/2013 21:08:31 ET T: 12/08/2013 22:34:42 ET JOB#: [id number]  cc: Aaron Mose. Hower, MD, <Dictator> DAVID Woodfin Ganja MD ELECTRONICALLY SIGNED 12/09/2013 20:55

## 2014-08-17 NOTE — Discharge Summary (Signed)
PATIENT NAME:  [name] Perkins, [name] Perkins MR#:  [id number] DATE OF BIRTH:  Jul 05, 1931  DATE OF ADMISSION:  05/25/2013 DATE OF DISCHARGE:  05/30/2013    CONSULTING CARDIOLOGIST: Dr. Ubaldo Glassing.  DISCHARGE DIAGNOSES: 1. Acute on chronic systolic congestive heart failure.  2. Atrial fibrillation.  3. Hypertension.  4. Valvular cardiomyopathy.   HISTORY OF PRESENT ILLNESS: Please see initial history and physical for details. Briefly, this is a 81 year old female on Coumadin for known mitral valve replacement,  EF of 45% and also COPD and is on home O2. She came in with increasing edema and shortness of breath. BNP was elevated.  Hospital course by issue:  1. CHF, acute on chronic, systolic. The patient was diuresed with IV Lasix 40 mg twice a day. She was -7 liters over the several days of admission. Her BMET remained stable. Potassium was repleted. She will be transitioned to 40 twice a day for the next several days orally as an outpatient.  2. Chronic atrial fibrillation. She was seen by Dr Ubaldo Glassing who stopped  the diltiazem and switced to Coreg. She remains on sotalol as well. She was also put on digoxin, but that was recommended to be discontinued.  3. Anticoagulation with mechanical mitral valve, St. Jude valve. She was continued on Coumadin which was super therapeutic initially but adjusted and will be discharged on 3.5 mg daily.  4. Diabetes. She was covered with sliding scale insulin, but will be reinitiated on her oral regimen.  5. Hypertension. Blood pressure remained stable. Her losartan was held as we diurese her. We can restart that as an outpatient.   DISCHARGE MEDICATIONS: 1. Synthroid 100 once a day.  2. Sotalol 160 twice a day.  3. Metformin 500 mg twice a day.  4. Tylenol p.r.n.  5. Glipizide 10 mg extended-release twice a day.  6. Omeprazole 40 once a day.  7. Warfarin 3.5 mg orally.  8. Lasix 40 mg tablets 1 tablet twice a day.  9. Potassium chloride 20 mEq twice a day.  10. Coreg 6.25 twice  a day.  11. Senna 1 twice a day.  12. Colace 1 twice a day.   The patient will stop the Cardizem as has since been switched to Coreg.   DISCHARGE OXYGEN: 2 liters by nasal cannula.   DISCHARGE DIET: Carbohydrate, ADA -controlled low salt diet, regular consistency.   DISCHARGE ACTIVITY: As tolerated. The patient was seen by physical therapy, who recommended skilled nursing facility for debility. She will be continued on her home oxygen and then return home with her husband.   FOLLOW-UP: The patient will follow up with Dr. Ola Perkins in 1 to 2 weeks.   TIME SPENT: This discharge took 40 minutes.   ____________________________ Cheral Marker. [name] Spurr, MD dpf:sg D: 05/30/2013 14:00:10 ET T: 05/30/2013 14:21:28 ET JOB#: [id number]  cc: Cheral Marker. [name] Spurr, MD, <Dictator> [name] [name] Spurr MD ELECTRONICALLY SIGNED 05/30/2013 16:07

## 2014-08-17 NOTE — H&P (Signed)
PATIENT NAME:  [name] Perkins, [name] Perkins MR#:  [id number] DATE OF BIRTH:  08-08-1931  DATE OF ADMISSION:  05/26/2013  REFERRING PHYSICIAN:  Dr. Carrie Mew.   PRIMARY CARE PHYSICIAN:  Dr. Ola Spurr.   PRIMARY CARDIOLOGIST:  Dr. Saralyn Pilar.   CHIEF COMPLAINT:  Shortness of breath, cough.   HISTORY OF PRESENT ILLNESS:  This is a 81 year old female who lives at home with her husband, she is on 2.5 liters oxygen at baseline, the patient presents with complaints of shortness of breath, cough with productive white sputum, over the last week, as well with exertional dyspnea, and worsening lower extremity edema, the patient is known to have history of congestive heart failure, diastolic and systolic, was recently seen by her cardiologist who increased her Lasix from 20 twice daily to 40 in the morning and 20 in the evening, the patient had worsening of her symptom which prompted her to come to ED, the patient had chest x-ray done which did show cardiomegaly with vascular congestion and a large right pleural effusion, the patient is known to have chronic right pleural effusion in the past most like related to her congestive heart failure, the patient had elevated BNP, husband reports that patient had similar episodes of congestive heart failure in the past where she required admission, the patient had improvement of her symptoms after receiving 40 mg of IV Lasix in the ED, as well the patient has right lower extremity ultrasound due to her significant edema with no evidence of DVT, husband reports the patient's lower extremity edema is chronic, but has been worsening recently.  The patient denies any chest pain, her EKG showing paced rhythm.   PAST MEDICAL HISTORY: 1.  Chronic A. Fib. 2.  Hypothyroidism. 3.  Rheumatic heart disease with St. Jude mitral valve replacement with mechanical valve, on warfarin.  4.  Type 2 diabetes.  5.  Status post pacemaker.  6.  Congestive heart failure.  7.  Chronic obstructive  pulmonary disease.  8.  Chronic respiratory failure, on 2.5 liters nasal cannula all day.  9.  History of right pleural effusion, chronic.  10.  Cardiomyopathy with ejection fraction 45%.   ALLERGIES:  PENICILLIN, SEPTRA, ERYTHROMYCIN AND PARAFON FORTE.   HOME MEDICATIONS: 1.  Cardizem CD 180 mg oral daily.  2.  Digoxin 250 mcg oral daily.  3.  Lasix 40 in the morning, 20 in the evening.  4.  Glipizide 5 mg twice daily.  5.  Levoxyl 100 mcg daily.  6.  Losartan 25 mg daily.  7.  Metformin 500 mg twice daily.  8.  Potassium 10 mEq twice daily.  9.  Sotalol 160 twice daily.  10.  Tylenol 325 1 to 2 by mouth as needed.  11.  Warfarin 5 mg at bedtime.   SOCIAL HISTORY:  Lives at home, never smoked, secondhand smoke from her husband who quit 40 years ago.  No alcohol use.   FAMILY HISTORY:  Significant for diabetes and coronary artery disease.   PAST SURGICAL HISTORY: 1.  Mechanical St. Jude mitral valve replacement due to rheumatic heart disease about 30 years ago.  2.  Pacemaker placement.  3.  Tonsillectomy.  4.  Left fracture repair.  5.  Bladder tumor surgery.   REVIEW OF SYSTEMS:  CONSTITUTIONAL:  The patient reports fatigue, weakness.  Denies fever, chills.  Denies weight gain, weight loss.  EYES:  Denies blurry vision, double vision, inflammation, glaucoma.  EARS, NOSE, THROAT:  Denies tinnitus, ear pain, epistaxis or discharge.  RESPIRATORY:  Complains of cough, wheezing, shortness of breath and COPD.  Denies hemoptysis.  CARDIOVASCULAR:  Denies chest pain, arrhythmia, palpitations, syncope.  Reports edema.  GASTROINTESTINAL:  Denies nausea, vomiting, diarrhea, abdominal pain, hematemesis, rectal bleed.  GENITOURINARY:  Denies dysuria, hematuria, renal colic.  ENDOCRINE:  Denies polyuria or polydipsia, heat or cold intolerance.  HEMATOLOGY:  Denies anemia, easy bruising, bleeding diathesis.  INTEGUMENT:  Denies acne, rash or skin lesion.  MUSCULOSKELETAL:  Denies neck  pain, back pain, arthritis or cramps, ambulates with a walker. NEUROLOGIC:  Denies CVA, TIA, headache, tremors.  PSYCHIATRIC:  Denies anxiety, insomnia or bipolar disorder or depression.   PHYSICAL EXAMINATION: VITAL SIGNS:  Temperature 97.6, pulse 85, respiratory rate 20, blood pressure 156/85, saturating 92% on oxygen.  GENERAL:  Elderly female who looks comfortable in bed, in no apparent distress, appears lethargic, weak.  HEENT:  Head atraumatic, normocephalic.  Pupils equal, reactive to light.  Pink conjunctivae.  Anicteric sclerae.  Moist oral mucosa.  NECK:  Supple.  No thyromegaly, +6 cm JVD.  No carotid bruits.  CHEST:  [place] air entry with slightly diminished on the right side, no wheezing.  No rales.  No rhonchi.  CARDIOVASCULAR:  S1, S2 heard with mechanical click, regular rate and rhythm.  EXTREMITIES:  +2 edema bilaterally.  No clubbing.  No cyanosis.  Pedal pulses +2 bilaterally.  ABDOMEN:  Soft, nontender, nondistended.  Bowel sounds present.  NEUROLOGIC:  Cranial nerves grossly intact.  Motor 5 out of 5.  No focal deficits.  PSYCHIATRIC:  Appropriate affect.  Awake, alert x 3.  Intact judgment and insight.  SKIN:  Warm and dry.  LYMPHATIC:  No cervical lymphadenopathy could be appreciated.   PERTINENT LABORATORY DATA:  Glucose 101.  BNP 3717, BUN 17, creatinine 0.77, sodium 131, potassium 4.5, chloride 98.  Troponin less than 0.02.  Digoxin less than 0.1.  White blood cell 5.4, hemoglobin 12.5, hematocrit 38.4, platelets 135, INR 4.5.  Urinalysis showing positive nitrites and 2 white blood cells and +3 bacteria.  Chest x-ray showing cardiomegaly with minimal to mild diffuse interstitial pulmonary edema, mild CHF and moderately large right pleural effusion with related atelectasis in the right lung.   ASSESSMENT AND PLAN: 1.  Acute congestive heart failure, the patient is known to have history of diastolic and systolic congestive heart failure, she will be on diuresis 40 mg  intravenous every 12 hours.  We will keep strict ins and outs.  We will have her on fluid restriction.  We will consult cardiology, Dr. Saralyn Pilar who is familiar with the patient who is going to admit her to tele, continue to cycle cardiac enzymes and follow the trend.  She is already on beta blockers, losartan and digoxin.  2.  Chronic atrial fibrillation, rate controlled.  Continue with sotalol, diltiazem and digoxin.  She is on anticoagulation.  3.  History of mechanical mitral valve replacement St. Jude valve.  We will continue with warfarin.  The patient has supratherapeutic INR currently.  We will have pharmacy to dose.  4.  Supratherapeutic INR.  We will hold warfarin.  We will have pharmacy to dose her warfarin for target INR 2.5 to 3.5.  5.  Type 2 diabetes.  We will hold all her oral agent and we will have her on insulin sliding scale.  6.  Hypertension, acceptable.  Continue losartan.  7.  Hypothyroidism.  Continue with Synthroid.  8.  Chronic respiratory failure.  The patient is at her baseline 2.5 liters nasal cannula.  9.  Chronic right pleural effusion, this is most likely related to her congestive heart failure and certainly contributing to her shortness of breath, should improve with diuresis.  10.  Deep vein thrombosis prophylaxis.  The patient is full dose anticoagulation with warfarin.  11.  CODE STATUS:  Discussed with the patient and her husband.  SHE IS A FULL CODE.   Total time spent on admission and patient care 55 minutes.    ____________________________ Albertine Patricia, MD dse:ea D: 05/26/2013 00:48:31 ET T: 05/26/2013 04:48:10 ET JOB#: [id number]  cc: Albertine Patricia, MD, <Dictator> Abdirahim Flavell Graciela Husbands MD ELECTRONICALLY SIGNED 05/27/2013 4:18

## 2014-08-17 NOTE — Discharge Summary (Signed)
PATIENT NAME:  [name] Perkins, [name] Perkins MR#:  [id number] DATE OF BIRTH:  Sep 06, 1931  DATE OF ADMISSION:  12/25/2013 DATE OF DISCHARGE:  12/28/2013  REASON FOR ADMISSION: Coagulopathy with hyperkalemia.   HISTORY OF PRESENT ILLNESS: The patient is a 81 year old female with a history of COPD, chronic respiratory failure, chronic diastolic congestive heart failure and valvular heart disease with A-fib, on anticoagulation, who presented to the office with weakness and fatigue and was found to have an INR greater than 10 with a potassium of 7. She was subsequently admitted for further evaluation.   PAST MEDICAL HISTORY: 1.  Chronic respiratory failure.  2.  COPD. 3.  Chronic diastolic congestive heart failure.  4.  Valvular heart disease.  5.  Atrial fibrillation.  6.  Recent pneumonia.  7.  Status post mitral valve replacement.  8.  Type 2 diabetes.   MEDICATIONS ON ADMISSION: Please see admission note.   ALLERGIES: PENICILLIN, SULFA, ERYTHROMYCIN, X-RAY DYE, PARAFON FORTE.   SOCIAL HISTORY: Negative for alcohol or tobacco abuse.   FAMILY HISTORY: Positive for coronary artery disease and diabetes.   REVIEW OF SYSTEMS: As per HPI.  PHYSICAL EXAMINATION: GENERAL: The patient was moderately ill-appearing, in some respiratory distress.  VITAL SIGNS: Stable and she was afebrile.  HEENT: Unremarkable.  NECK: Supple without JVD.  LUNGS: Revealed crackles bilaterally.  CARDIAC: Regular rate and rhythm with normal S1 and S2. A 2/6 systolic murmur was noted.  ABDOMEN: Soft and nontender.  EXTREMITIES: Revealed 1+ edema.  NEUROLOGIC: Grossly nonfocal.   HOSPITAL COURSE: The patient was admitted with coagulopathy due to excess Coumadin as well as hyperkalemia. She was found to be in acute renal failure. She had chronic diastolic CHF. She was admitted. Her Coumadin was held. She was given vitamin K. Her INR improved and she was placed back on Coumadin at a lower dose. No evidence of bleeding during the  hospitalization. Her CHF remained stable during the hospitalization as did her oxygenation. Her hyperkalemia was treated and resolved. Her potassium remained stable in the hospital. She was extremely weak. Physical therapy recommended skilled nursing. The family agreed. She is now transferred to the skilled nursing facility for further care and rehabilitation.   DISCHARGE DIAGNOSES: 1.  Hyperkalemia, resolved.  2.  Acute renal failure, resolved.  3.  Coagulopathy due to Coumadin, resolved.  4.  Chronic obstructive pulmonary disease.  5.  Chronic respiratory failure.  6.  Chronic diastolic congestive heart failure.  7.  Valvular heart disease, status post mitral valve replacement.  8.  Type 2 diabetes.   DISCHARGE MEDICATIONS: 1.  Oxygen at 2 liters per minute per nasal cannula.  2.  Digoxin 0.25 mg p.o. daily.  3.  Colace 100 mg p.o. b.i.d.  4.  Synthroid 100 mcg p.o. daily.  5.  Zofran 4 mg p.o. q. 4 hours p.r.n. nausea and vomiting.  6.  Protonix 40 mg p.o. daily.  7.  Sotalol 160 mg p.o. b.i.d.  8.  Lasix 40 mg p.o. daily.  9.  Coumadin 3 mg p.o. every p.m.  10.  Nystatin powder to rash t.i.d.  11.  Cipro 250 mg p.o. b.i.d. x1 week.  12.  DuoNeb SVN q. 4 hours p.r.n. shortness of breath.  13.  Losartan 25 mg p.o. daily. 14.  Nitrostat 0.4 mg sublingually p.r.n. chest pain.  15.  Harper's low-dose sliding scale insulin with Accu-Cheks before meals and at bedtime.   FOLLOW-UP PLANS AND APPOINTMENTS: The patient will be followed by the resident physician at  the skilled nursing facility. She is a full code. She will be seen in consultation by physical therapy. She is on a 2 gram sodium diet. We will obtain a CBC, MET-B, pro time, and urine culture in 1 week.   ____________________________ Leonie Douglas. Doy Hutching, MD jds:sb D: 12/28/2013 08:06:03 ET T: 12/28/2013 08:21:02 ET JOB#: [id number]  cc: Leonie Douglas. Doy Hutching, MD, <Dictator> Alexza Norbeck Lennice Sites MD ELECTRONICALLY SIGNED 12/28/2013 15:50

## 2014-08-17 NOTE — Discharge Summary (Signed)
PATIENT NAME:  WANA, MOUNT MR#:  [id number] DATE OF BIRTH:  [birth year]  DATE OF ADMISSION:  12/08/2013 DATE OF DISCHARGE:  12/11/2013  TYPE OF DISCHARGE: The patient is transferred to a skilled nursing facility.   REASON FOR ADMISSION: Shortness of breath.   HISTORY OF PRESENT ILLNESS: The patient is a 81 year old female with a long-standing history of COPD and chronic respiratory failure as well as chronic diastolic congestive heart failure and valvular heart disease. Presented to the Emergency Room with worsening shortness of breath. In the Emergency Room, the patient was noted to be short of breath with chronic CHF and pneumonia noted on chest x-ray. She was admitted for further evaluation.   PAST MEDICAL HISTORY: 1.  COPD. 2.  Chronic respiratory failure.  3.  History of atrial fibrillation.  4.  Chronic diastolic congestive heart failure.  5.  Valvular heart disease, status post mitral valve replacement.  6.  Type 2 diabetes.   MEDICATIONS ON ADMISSION: Please see admission note.   ALLERGIES: CONTRAST DYE, ERYTHROMYCIN, PARAFON FORTE, PENICILLIN, AND SULFA.   SOCIAL HISTORY: Negative for alcohol or tobacco abuse.   FAMILY HISTORY: Positive for coronary artery disease and diabetes.   REVIEW OF SYSTEMS: As per HPI.  PHYSICAL EXAMINATION: The patient was in no acute distress. Vital signs were stable and she was afebrile. HEENT exam was unremarkable. Neck was supple without JVD. Lungs revealed decreased breath sounds with right basilar crackles. Cardiac exam revealed a regular rate and rhythm with normal S1, S2. Abdomen was soft and nontender. Extremities revealed 2+ edema. Neurologic exam was grossly nonfocal.   HOSPITAL COURSE: The patient was admitted with pneumonia and acute respiratory distress with underlying chronic diastolic congestive heart failure. She was seen by cardiology. Her elevated troponin was due to demand ischemia and not an acute MI. She was maintained on Lovenox  and Coumadin. Her INR became therapeutic. Her respiratory status improved. She was initially maintained on IV antibiotics for her pneumonia, but switched to oral antibiotics. Physical therapy saw the patient and recommended rehab at skilled nursing. The family agreed. She is now transferred to the skilled nursing facility for further care and rehabilitation.   DISCHARGE DIAGNOSES: 1.  Community-acquired pneumonia.  2.  Chronic diastolic congestive heart failure.  3.  Valvular  heart disease status post mitral valve replacement.  4.  Elevated troponin due to demand ischemia.  5.  Type 2 diabetes.  6.  Chronic obstructive pulmonary disease.  7.  Acute on chronic respiratory failure.   DISCHARGE MEDICATIONS: 1.  Oxygen at 2 liters per minute per nasal cannula.  2.  Coreg 6.25 mg p.o. b.i.d.  3.  Lasix 40 mg p.o. daily.  4.  Coumadin 6 mg p.o. q. p.m.  5.  Synthroid 100 mcg p.o. daily.  6.  Metformin 500 mg p.o. b.i.d.  7.  Aspirin 81 mg p.o. daily.  8.  Zofran 4 mg p.o. q. 4 hours p.r.n. nausea and vomiting.  9.  DuoNeb SVNs q.i.d.  10.  Ceftin 500 mg p.o. b.i.d. x10 days.  11.  Sotalol 160 mg p.o. b.i.d.  12.  Klor-Con 20 mEq p.o. b.i.d.  13.  Protonix 40 mg p.o. daily.  14.  Nitrostat 0.4 mg sublingually p.r.n. chest pain. 15.  Harper's low-dose sliding scale insulin with Accu-Cheks before meals and at bedtime.   FOLLOW-UP PLANS AND APPOINTMENTS: The patient will be followed by the resident physician at the skilled nursing facility. She is a FULL code. She is on a  2 gram sodium, 1800 calorie ADA diet. She will be seen in consultation by physical therapy. Will obtain a CBC, MET-B, and a PT/INR on 12/13/2013.   ____________________________ Leonie Douglas. Doy Hutching, MD jds:sb D: 12/11/2013 07:06:01 ET T: 12/11/2013 07:33:33 ET JOB#: [id number]  cc: Leonie Douglas. Doy Hutching, MD, <Dictator> Jordan Pardini Lennice Sites MD ELECTRONICALLY SIGNED 12/11/2013 12:34

## 2014-08-17 NOTE — H&P (Signed)
PATIENT NAME:  [name] Perkins, [name] Perkins MR#:  [id number] DATE OF BIRTH:  07/16/1931  DATE OF ADMISSION:  12/25/2013  PRIMARY CARE PHYSICIAN:  Leonie Douglas. Sparks, MD  HISTORY OF PRESENT ILLNESS: The patient is a 81 year old Caucasian female with past medical history significant for history of right-sided heart failure, chronic congestive heart failure diastolic, COPD, chronic respiratory failure, history of recent admission for pneumonia on 12/08/2013 through 12/11/2013, who presented to the hospital with complaints of shortness of breath as well as weakness. The patient was doing well after discharge from rehabilitation where she stayed approximately 7 days. She came back home last week; however, after coming back home, she was noted to be very weak. She also was complaining of shortness of breath, which seems to be worse than usual. She has been laying and sleeping  in a recliner because of significant shortness of breath. She tells me that she cannot lay down flat. She admits to having some lower extremity swelling or redness; however, tells me that her swelling is about the same as it has usually been. She states that her Lasix dose got cut down from around 100 mg daily dose to 40 mg daily dose, and she feels that it might not be enough. In the Emergency Room she was noticed to have acute renal failure with creatinine going up from her 0.9 on 12/11/2013 to 1.75 today on 12/25/2013. Her potassium also was found to be high at 6.8; however, apparently her potassium level was even higher, 7 to 7.1 yesterday. Hospitalist services were contacted for admission.   PAST MEDICAL HISTORY: Significant for a history of COPD, chronic respiratory failure, history of admission for pneumonia on 12/08/2013 through 12/11/2013, history of chronic diastolic CHF, valvular heart disease, status post mitral valve replacement on chronic Coumadin anticoagulation, history of diabetes type 2, history of 3 L of oxygen nasal cannula at baseline.  History of AFib.   SOCIAL HISTORY: No alcohol, tobacco or drug abuse. He uses a cane as well as a walker for ambulation.   FAMILY HISTORY: Diabetes mellitus, as well as coronary artery disease.   ALLERGIES: CONTRAST DYE, ERYTHROMYCIN, PARAFON FORTE AS WELL AS PENICILLIN AND SEPTRA.   MEDICATIONS: According to medical records, the patient is on: Cozaar 25 mg p.o. daily, Lasix 40 mg p.o. daily, albuterol ipratropium 2.5/0.5 mg in 3 mL inhalation solution as needed, digoxin 0.25 mg p.o. daily, thyroxine 100 mcg p.o. daily, metformin 500 mg p.o. twice daily, Nitrostat 0.4 mg sublingually every 5 minutes as needed, pantoprazole 40 mg p.o. daily, potassium chloride 20 mg twice daily, sotalol 160 mg p.o. twice daily, warfarin 1 mg p.o. 6 tablets daily, Zofran ODT 1 tablet daily as needed.   REVIEW OF SYSTEMS: CONSTITUTIONAL:  Positive for feeling weak, always cold, fatigued and tired , left eye blindness, right eye vision problems, cough as well as some wheezes intermittently, shortness of breath which seems to be worse than usual especially when she lies down, nasal drip, not able to walk much using a walker, very weak and short of breath. Denies any high fevers, pains, weight loss or gainnd EYES: Denies any blurry vision, double vision, glaucoma or cataracts.  EARS, NOSE, AND THROAT: Denies tinnitus, allergies, epistaxis, sinus pain, dentures, difficulty swallowing.  RESPIRATORY: Denies hemoptysis, asthma.  CARDIOVASCULAR: Denies chest pain, orthopnea, edema, arrhythmias, palpitations or syncope. GASTROINTESTINAL: Denies nausea, diarrhea or constipation.  GENITOURINARY: Denies dysuria, hematuria, frequency or incontinence.  ENDOCRINOLOGY: Denies polydipsia, nocturia, thyroid problems, or heat or cold intolerance or thirst. HEMATOLOGIC:  Denies any anemia, bruising, bleeding or swollen glands. SKIN: Denies acne, rashes or change in moles.  MUSCULOSKELETAL: Denies arthritis, cramps, swelling.   NEUROLOGICAL: Denies epilepsy or tremor.  PSYCHIATRIC: Denies anxiety, insomnia, depression.   PHYSICAL EXAMINATION:  VITAL SIGNS: On arrival to the hospital, temperature was 97.7, pulse was 66, respirations were 17, blood pressure 115/90, saturation was 98% on oxygen therapy.  GENERAL: This is a well-developed, well-nourished Caucasian female, in no significant distress, lying on the stretcher.  HEENT: Her pupils are equal and reactive to light. Extraocular muscles are intact. No icterus or conjunctivitis.  Normal hearing. No pharyngeal erythema. Mucosa is dry.  NECK: No masses. Supple, nontender. Thyroid is not enlarged. No adenopathy. No JVD or carotid bruits, bilaterally. Full range of motion.  LUNGS: Clear to auscultation in all fields. No rales, rhonchi, diminished breath sounds. No labored inspirations, increased effort, dullness to percussion or overt respiratory distress. The patient does have markedly diminished breath sounds at the bases though.  CARDIOVASCULAR: S1, S2 appreciated. Rhythm is regular. PMI not lateralized. Chest is nontender to palpation. There are 1+ pedal pulses. A 2/6 systolic murmur was heard. Pedal pulses are palpable. There is 2+ to 3+ lower extremity edema. No calf tenderness or cyanosis was noted.  ABDOMEN: Soft, nontender. Bowel sounds are present. No hepatosplenomegaly or masses were noted.  RECTAL: Deferred.  MUSCLE STRENGTH: Able to move all extremities. No cyanosis. Degenerative joint disease. Mild kyphosis. Gait was not tested.  SKIN: Did not reveal some brownish discoloration in the lower extremities as well as some petechial lesions in the lower extremities below the knees; no erythema, nodularity or induration. Skin was warm and dry to palpation. Significant swelling in the lower extremities was noted bilaterally, more on the right side, but no calf tenderness or cyanosis.  LYMPHATIC: No adenopathy in the cervical region.  NEUROLOGIC: Cranial nerves grossly  intact. Sensory intact. No dysarthria or aphasia. The patient is alert, oriented to person and place. Cooperative. Memory is impaired, but no signs of confusion, agitation, or depression were noted.   LABORATORY DATA: BMP revealed BUN and creatinine of 44 and 1.75, as compared to 17 and 0.9 on 12/11/2013. Sodium 130, potassium 6.8, glucose level of 162, beta-type natriuretic peptide was 5950. Estimated GFR for non-African American would be 27. Liver tests were not done. The patient's CK total MB fraction as well as troponin were 51 and 1.1 and less than 0.02. Digoxin level 0.1 CBC: White blood cell count 6.9, hemoglobin 11.0, platelet count 273,000. Pro-time 81.3, INR 10.9.   RADIOLOGIC STUDIES: Chest x-ray, portable single view, 12/25/2013, revealed a small right pleural effusion, associated basilar airspace disease, appears slightly improved since the most recent study. Cardiomegaly without edema. EKG revealed ventricular paced rhythm at 69 beats per minute; abnormal EKG, but no significant change since prior EKG.   ASSESSMENT AND PLAN:  1.  Acute renal failure. Admit the patient to the medical floor. Continue her on IV fluids at a low rate, following creatinine in the morning. We are holding Cozaar as well as metformin.  2.  Hyperkalemia. We will continue IV fluids. One dose of Lasix will be given as well as Kayexalate. We will follow potassium level later today, as well as in the morning.  3.  Coagulopathy acquired, due to Coumadin use. Vitamin K was also given in the Emergency Room; however, the patient is not bleeding. Following INR in the morning and holding Coumadin.  4.  Congestive heart failure, chronic diastolic. Seems to  be stable. Unfortunately, will need to hold Cozaar as well as Lasix at this time, and follow the patient's oxygenation.  5.  Diabetes mellitus. We will continue sliding scale insulin, holding the metformin as mentioned above.    TIME SPENT: 50 minutes     ____________________________ Theodoro Grist, MD rv:MT D: 12/25/2013 13:17:07 ET T: 12/25/2013 15:19:16 ET JOB#: [id number]  cc: Theodoro Grist, MD, <Dictator> Leonie Douglas. Doy Hutching, MD Theodoro Grist MD ELECTRONICALLY SIGNED 01/22/2014 14:52

## 2014-08-17 NOTE — Consult Note (Signed)
PATIENT NAME:  [name] Perkins, [name] Perkins MR#:  [id number] DATE OF BIRTH:  07/13/1931  DATE OF CONSULTATION:  09/21/2013  REFERRING PHYSICIAN:   CONSULTING PHYSICIAN:  Isaias Cowman, MD  PRIMARY CARE PHYSICIAN: Ola Spurr.   CARDIOLOGIST: Isaias Cowman, M.D.   CHIEF COMPLAINT: Shortness of breath.   HISTORY OF PRESENT ILLNESS: The patient is a 81 year old female with history of valvular heart disease status post mitral valve replacement, atrial fibrillation, recurrent congestive heart failure, hypertension and sick sinus syndrome status post pacemaker. The patient apparently was in her usual state of health until the past 1 to 2 weeks when she has been experiencing increasing shortness of breath, mostly with exertion and with occasional shortness of breath at rest. The patient was brought to [facility] Emergency Room where chest x-ray revealed evidence for pulmonary vascular congestion. She was given intravenous furosemide with modest diuresis and with mild clinical improvement. Admission labs were notable for a negative troponin less than 0.02.   PAST MEDICAL HISTORY:  1. Status post mitral valve replacement with St. Jude valve in 1992.  2. Paroxysmal atrial fibrillation.  3. Recurrent congestive heart failure.  4. Hypertension.  5. Sick sinus syndrome, status post pacemaker.  6. Mild aortic stenosis.  7. Hypothyroidism.  8. COPD.   MEDICATIONS ON ADMISSION: Cardizem CD 180 mg daily, sotalol 160 mg b.i.d., losartan 25 mg daily, warfarin 5 mg daily, Klor-Con 10 mEq daily, furosemide 20 mg b.i.d., Prilosec 40 mg daily, Levoxyl 100 mcg daily, metformin 500 mg b.i.d., glipizide 10 mg b.i.d.   SOCIAL HISTORY: The patient is married, lives with her husband. Denies prior tobacco abuse history.   FAMILY HISTORY: No immediate family history of coronary artery disease or myocardial infarction.   REVIEW OF SYSTEMS:  CONSTITUTIONAL: No fever or chills.  EYES: No blurry vision.  EARS: No hearing loss.   RESPIRATORY: The patient has had progressive shortness of breath as described above.  CARDIOVASCULAR: The patient denies chest pain.  GASTROINTESTINAL: No nausea, vomiting or diarrhea.  GENITOURINARY: No dysuria or hematuria.  ENDOCRINE: No polyuria or polydipsia.  MUSCULOSKELETAL: No arthralgias or myalgias.  NEUROLOGICAL: No focal muscle weakness or numbness.  PSYCHOLOGICAL: No depression or anxiety.   PHYSICAL EXAMINATION:  VITAL SIGNS: Blood pressure 139/69, pulse 64, respirations 20, temperature 97.6, pulse ox 96%.  HEENT: Pupils equal, reactive to light and accommodation.  NECK: Supple without thyromegaly.  LUNGS: Revealed a few crackles in the bases.  CARDIOVASCULAR: Normal JVP. Normal PMI. Regular rate and rhythm. Crisp mitral valve prosthetic sound.  ABDOMEN: Soft and nontender.  EXTREMITIES: No cyanosis, clubbing or edema. Pulses were intact bilaterally.  MUSCULOSKELETAL: Normal muscle tone.  NEUROLOGIC: The patient is alert and oriented x 3. Motor and sensory both grossly intact.   IMPRESSION: A 81 year old female with known valvular heart disease status post mitral valve replacement, with recurrent history of diastolic congestive heart failure, likely multifactorial. The patient presents with shortness of breath consistent with congestive heart failure with pulmonary congestion on chest x-ray, appears to be clinically responding to diuresis.   RECOMMENDATIONS:  1. Agree with overall current therapy.  2. Continue diuresis.  3. Would defer further cardiac diagnostics at this time.  4. The patient may require rehab after discharge since the patient has shown generalized decline over the past 1 to 2 years.    ____________________________ Isaias Cowman, MD ap:gb D: 09/21/2013 25:95:63 ET T: 09/21/2013 21:37:34 ET JOB#: [id number]  cc: Isaias Cowman, MD, <Dictator> Isaias Cowman MD ELECTRONICALLY SIGNED 10/09/2013 8:28

## 2014-08-17 NOTE — H&P (Signed)
PATIENT NAME:  [name], Perkins MR#:  [id number] DATE OF BIRTH:  January 26, 1932  DATE OF ADMISSION:  09/20/2013  PRIMARY CARE PHYSICIAN:  Dr. Ola Spurr.   CHIEF COMPLAINT:  Difficulty breathing and weakness.   HISTORY OF PRESENT ILLNESS:  [name] is a 81 year old Caucasian female with past medical history significant for chronic respiratory failure secondary to COPD on 2.5 liters home oxygen, combined chronic systolic and diastolic congestive heart failure, chronic A. Fib on Coumadin, sick sinus syndrome, status post pacemaker, rheumatic heart disease, status post mechanical mitral valve, hypertension, diabetes, comes from home secondary to worsening dyspnea over the last week.  The patient had a similar presentation and was hospitalized for CHF exacerbation in January 2015, at which time her Lasix doses were increased.  According to husband, who gives most of the history, the patient was doing fine up until last week when he noticed that her breathing was getting worse.  At baseline she uses a cane and is able to just walk around the house.  Most of the walking is limited by dyspnea on moderate exertion.  However, over the last week her walking has decreased and today it got to the point that she could not even get up and had dyspnea at rest, so brought to the hospital.  She takes 80 mg of Lasix in the morning and 40 mg of Lasix at bedtime.  Her usual weight is 132 pounds and she is up to 135 pounds today.  She says she does not use any salt in her diet and also watches her fluid intake.  X-ray shows mild pulmonary vascular congestion so she is being admitted for CHF exacerbation.   PAST MEDICAL HISTORY: 1.  Chronic A. Fib, on Coumadin.  2.  Sick sinus syndrome, status post pacemaker.  3.  Chronic respiratory failure secondary to COPD on 2.5 liters home oxygen.  4.  Hypothyroidism.  5.  Rheumatic heart disease, status post mechanical mitral valve placement.  6.  Type 2 diabetes mellitus.  7.   Combined systolic and diastolic congestive heart failure, unknown ejection fraction at this time.  8.  History of chronic right pleural effusion.   PAST SURGICAL HISTORY:  1.  Mitral valve replacement.  2.  Pacemaker placement.  3.  Tonsillectomy.  4.  Bladder tumor surgery.   ALLERGIES TO MEDICATIONS:  PENICILLIN, SEPTRA, ERYTHROMYCIN, X-RAY DYE.    HOME MEDICATIONS:  1.  Coreg 6.25 mg by mouth twice daily.  2.  Lasix 40 mg by mouth daily in the evening.  3.  Lasix 80 mg in the morning.  4.  Glipizide 10 mg twice a day.  5.  Levoxyl 100 mcg by mouth daily.  6.  Metformin 500 mg by mouth twice daily.  7.  Prilosec 40 mg by mouth daily.  8.  Potassium chloride 20 mEq by mouth twice a day.  9.  Sotalol 160 mg by mouth twice daily.  10.  Tylenol 325 mg tablet 1 to 2 tablets as needed for pain or fever.  11.  Warfarin 3.5 mg by mouth daily.   SOCIAL HISTORY:  Lives at home with her husband.  Uses a cane for ambulation at baseline.  Most of the walking is limited secondary to dyspnea.  No smoking at all, exposed to secondhand, but nothing over the past 40 years.  No alcohol use.   FAMILY HISTORY:  Significant for diabetes and coronary artery disease.   REVIEW OF SYSTEMS:  CONSTITUTIONAL:  No fever.  Positive for  fatigue and weakness.  EYES:  No blurred vision, double vision, inflammation or glaucoma.  Had right eye cataract taken out.  The patient does not have any vision in her left eye from diabetic retinopathy.  EARS, NOSE, THROAT:  No tinnitus, ear pain, hearing loss, epistaxis or discharge.  RESPIRATORY:  Positive for cough.  COPD.  No hemoptysis.  CARDIOVASCULAR:  No chest pain.  Positive for orthopnea, dyspnea on exertion and also arrhythmia.  No palpitations or syncope.  GASTROINTESTINAL:  No nausea, vomiting, diarrhea, abdominal pain, hematemesis, or melena.  GENITOURINARY:  No dysuria, hematuria, renal calculus, frequency or incontinence.  ENDOCRINE:  No polyuria, nocturia,  thyroid problems, heat or cold intolerance.  HEMATOLOGY:  No anemia, easy bruising or bleeding.  SKIN:  No acne, rash or lesions.  MUSCULOSKELETAL:  No neck, back, shoulder pain.  Positive for arthritis.  NEUROLOGIC:  No numbness, weakness, CVA, TIA or seizures.  PSYCHOLOGICAL:  No anxiety, insomnia or depression.   PHYSICAL EXAMINATION:   VITAL SIGNS:  Temperature 97.9 degrees Fahrenheit, pulse 70, respirations 26, blood pressure 175/79, pulse ox 97% on 2.5 liters oxygen.  GENERAL:  Well-built, well-nourished female sitting in bed, not in any acute distress.  HEENT:  Normocephalic, atraumatic.  Pupils equal, round, reacting to light.  Anicteric sclerae.  Extraocular movements intact.  Oropharynx clear without erythema, mass or exudates.  NECK:  Supple.  No thyromegaly, JVD or carotid bruits.  No lymphadenopathy.  LUNGS:  Moving air bilaterally.  Decreased right basilar breath sounds.  Occasional crackles.  No wheezing.  No use of accessory muscles for breathing.  CARDIOVASCULAR:  S1, S2, regular rate and rhythm, loud 3 by 6 systolic murmur heard in the mitral area.  No rubs or gallops.  ABDOMEN:  Soft, nontender, nondistended.  No hepatosplenomegaly.  Normal bowel sounds.  EXTREMITIES:  1+ pedal edema bilaterally.  The right leg appears slightly more swollen than the left one, but good dorsalis pedis pulses palpable bilaterally.  SKIN:  No acne, rash or lesions.  LYMPHATICS:  No cervical lymphadenopathy.  NEUROLOGIC:  Cranial nerves intact.  No focal motor or sensory deficits.  PSYCHOLOGICAL:  The patient is awake, alert, oriented x 3.   LABORATORY DATA:  WBC 6.8, hemoglobin 11.4, hematocrit 34.9, platelet count 176.   Sodium 129, potassium 4.7, chloride 92, bicarb 30, BUN 18, creatinine 0.81, glucose of 126 and calcium of 9.7.  BNP is elevated at 4935.  Chest x-ray showing cardiomegaly, pulmonary vascular congestion without overt edema.  Moderate right pleural effusion and right basilar  atelectasis seen.  Troponin is negative.  EKG showing a paced rhythm, heart rate of 66.   ASSESSMENT AND PLAN:  A 81 year old female with chronic combined congestive heart failure on high doses of Lasix as an outpatient, hypertension, atrial fibrillation, diabetes, mechanical mitral valve on Coumadin, admitted for progressively worsening weakness and dyspnea at rest.  1.  Acute on chronic combined heart failure.  Lasix 40 mg intravenous q. 8 hours.  Cardiology consult.  Echocardiogram and continue medication management.  2.  Atrial fibrillation, rate controlled on sotalol and Coreg, status post pacemaker, currently on Coumadin.  Follow-up INR.  3.  Mechanical mitral valve.  Continue Coumadin.  Follow-up INR.  4.  Diabetes mellitus:  On metformin, sliding scale.  Does not know the right dosage of glipizide, so we will hold off on that.  5.  Hypothyroidism.  Continue Synthroid.  6.  Weakness.  Will need physical therapy consult and need rehab or home health  at the time of discharge.  7.  CODE STATUS:  FULL CODE.  Time spent on admission is 50 minutes.     ____________________________ Gladstone Lighter, MD rk:ea D: 09/20/2013 22:15:56 ET T: 09/20/2013 23:54:10 ET JOB#: [id number]  cc: Gladstone Lighter, MD, <Dictator> Gladstone Lighter MD ELECTRONICALLY SIGNED 09/21/2013 12:44

## 2014-08-17 NOTE — Consult Note (Signed)
   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 CXR on admission revealed evidence of pulmonary edema. She has a cough productive of thick sputum. She denies fever or chills. She denies chest pain other than when she is coughing. EKG revealed AV pacing. She is not aware if her weight is up at home. She was given iv lasix with some improvement.   Physical Exam:  GEN well nourished   HEENT moist oral mucosa   NECK No masses   RESP rhonchi  crackles   CARD Regular rate and rhythm  Murmur   Murmur Systolic   Systolic Murmur axilla   ABD denies tenderness  normal BS   LYMPH negative neck, negative axillae   EXTR negative cyanosis/clubbing, positive edema   SKIN normal to palpation   NEURO cranial nerves intact, motor/sensory function intact   PSYCH A+O to time, place, person   Review of Systems:  Subjective/Chief Complaint sob with cough   General: Fatigue  Weakness  Trouble sleeping   Skin: No Complaints   ENT: No Complaints   Eyes: No Complaints   Neck: No Complaints   Respiratory: Frequent cough  Short of breath  Sputum   Cardiovascular: Dyspnea  Edema   Gastrointestinal: No Complaints   Genitourinary: No Complaints   Vascular: No Complaints   Musculoskeletal: No Complaints   Neurologic: No Complaints   Hematologic: No Complaints   Endocrine: No Complaints   Psychiatric: No Complaints   Review of Systems: All other systems were reviewed and found to be negative   Medications/Allergies Reviewed Medications/Allergies reviewed   EKG:  Interpretation AV paced rhythm    x ray dye - - 'makes me feel sick': Other  Penicillin: Unknown  Erythromycin: Unknown  Parafon Forte  DSC: Unknown  Septra: Unknown   Impression 81 yo female with history of mitral vavle disease s/p mvr with a St. Jude mechanical device treated with warfarin for anticoagulation, history heart block treated with dual chamber pacemaker which is funcitoning normally with av pacing, history of afib treated with sotolol who was admitted with evidence of volume overload by symptoms and cxr. NYHA Class IV with acute on chronic systollic heart failure. Improving withdiuresis. CXR suggested rll atelectasis vs pneumonia. WIll need to closely follow this as diuresis progresses. Pt has cough productive of thick sputum but no fever or chills. Has ruled out for mi. Will continue diuresis and continue with warfarin with inr goal of 2.5-3.5 due to valve.   Plan 1. Conitnue with diuresis following renal function and electrolytes 2. Continue with warfarin with inr goal of 2.5 -3.5 3. Continue with sotolol for afib with rate cotnrol with digoxin. 4. Continue with losartan for afterload reduction. 5. Will discontinue cardizem and place on carvedilol for beta blocker therapy and rate control given reduced lv funciton.   Electronic Signatures: Teodoro Spray (MD)  (Signed 31-Jan-15 10:11)  Authored: General Aspect/Present Illness, History and Physical Exam, Review of System, Home Medications, EKG , Allergies, Impression/Plan   Last Updated: 31-Jan-15 10:11 by Teodoro Spray (MD)

## 2014-08-17 NOTE — Consult Note (Signed)
Present Illness The patient is a 81 year old female with history of valvular heart disease status post mitral valve replacement with a St. Jude device in 1992, history of  atrial fibrillation, recurrent diastollic congestive heart failure, hypertension and sick sinus syndrome status post pacemaker. The patient apparently was in her usual state of health until the past 1 to 2 weeks when she has been experiencing increasing shortness of breath, mostly with exertion and with occasional shortness of breath at rest. The patient was brought to [facility] Emergency Room where chest x-ray revealed evidence for mild right pleural effusion. She was given intravenous furosemide with modest diuresis and with mild clinical improvement. Admission labs were notable for a serum troponin of 0.07. She deneid any chest pain. She appears to have ruled out for an mi. EF by echo recently showed an ef of  55%. Her inr was subtheraputic. Was placxe on heparin. She currently feels well at her baseline. EKG revealed afib with intermitant v pace.   Physical Exam:  GEN well nourished, no acute distress   HEENT PERRL, hearing intact to voice   NECK supple   RESP normal resp effort  no use of accessory muscles  rhonchi   CARD Irregular rate and rhythm  Murmur   Murmur Systolic   Systolic Murmur axilla   ABD denies tenderness  no hernia  normal BS   LYMPH negative neck, negative axillae   EXTR negative cyanosis/clubbing, negative edema   SKIN normal to palpation   NEURO cranial nerves intact   PSYCH A+O to time, place, person   Review of Systems:  Subjective/Chief Complaint sob on admission. back to baseline now.   General: No Complaints   Skin: No Complaints   ENT: No Complaints   Eyes: No Complaints   Neck: No Complaints   Respiratory: Short of breath   Cardiovascular: Dyspnea   Gastrointestinal: No Complaints   Genitourinary: No Complaints   Vascular: No Complaints   Musculoskeletal: No  Complaints   Neurologic: No Complaints   Hematologic: No Complaints   Endocrine: No Complaints   Psychiatric: No Complaints   ROS Pt not able to provide ROS   Family & Social History:  Family and Social History:  Family History Non-Contributory   Social History negative tobacco   EKG:  Interpretation afib with intermitantly v paced.    x ray dye - - 'makes me feel sick': Other  Penicillin: Unknown  Erythromycin: Unknown  Parafon Forte DSC: Unknown  Septra: Unknown   Impression Pt with history of afib, mvr adn diastollic heart failure admitted with progressive shorntess of breath. CXR shows mild right pleural effusion with no high grade chf. Her ef is 55 with tr and pulmonary hypertension by echo several months ago. Has improved with diuresis and oxygen. She has ruled out for mi. Minimal troponin elevation secondary to deman. No evidence of acute coronary syndrome. Agree with careful diuresis following renal function. Her inr was subtherapeutic . Agree iwth heparin and increasing coumadin. Appears to have had a mild exacerbation of her diastollic chronic chf on top of pulmonary disease.PPM is functioning well.   Plan 1. Careful diuresis. Will convert to po lasix and follow 2. Agree with increaseing warfarin. INR goal is 2.5 to 3.5. Her warfaron apparently was recently incrased. If level not improved in am would increase to 6 mg 3. Continue with sotolol at 160 bid for afib 4.Follow on telemtry 5. Amublate and consider discharge in next 24-48 hours if stable.   Electronic  Signatures: Teodoro Spray (MD)  (Signed 16-Aug-15 09:23)  Authored: General Aspect/Present Illness, History and Physical Exam, Review of System, Family & Social History, EKG , Allergies, Impression/Plan   Last Updated: 16-Aug-15 09:23 by Teodoro Spray (MD)

## 2014-08-18 NOTE — Discharge Summary (Signed)
PATIENT NAME:  [name] Perkins, [name] Perkins MR#:  [id number] DATE OF BIRTH:  June 12, 1931  DATE OF ADMISSION:  06/11/2011 DATE OF DISCHARGE:  06/15/2011  HISTORY: Ms. Lasure is a 81 year old female with an aortic valve replacement, chronic atrial fibrillation with sick sinus and pacemaker, known cardiomyopathy with ejection fractions in 40%, insulin oral agent dependent diabetic, chronic obstructive pulmonary disease with known left pleural effusion, O2 dependent, had overall not done well, had several falls and was brought to the Emergency Room with increasing shortness of breath and cough. Admitting diagnosis was acute on chronic respiratory failure in the setting of systolic dysfunction with congestive heart failure. She was started on some diuretics, Solu-Medrol, steroids which greatly improved her symptoms.   HOSPITAL COURSE: Blood pressure remained stable. She had a urinary tract infection with multiple organisms sensitive to most drugs except she was treated with Levaquin which the urine infection was not sensitive to this antibiotic. She was switched to Rocephin. Physical therapy found her extremely weak and unable to really ambulate safely for her balances issues and felt that she would benefit from some rehab to strengthen herself. In general, she has been in decline for several months prior to this admission. She was seen in consultation by the cardiology group that is following her and felt that her current medications were appropriate. She was seen in follow-up by her pulmonologist, Dr. Raul Del, who also felt like tapering the steroids was appropriate at this point.   LABORATORY, RADIOLOGICAL AND DIAGNOSTIC DATA: At the time of admission, sugar was 286. Beta natriuretic peptide hormone was 5,090. BUN 15, creatinine 0.56, sodium 125, potassium 5.5, chloride 89. Liver function studies: alkaline phosphatase 152, SGOT elevated at 47 and troponins were negative. CPK and isoenzymes were negative. TSH was normal at  3.58. Digoxin level was normal at 1.69. White count was 18,800 and hemoglobin was 12. Blood cultures were negative. Laboratory studies on 06/15/2011: sugar was 264, BUN 26, creatinine 0.74, sodium 133, which had improved with hydration, potassium 4.5, chloride 91, eGFR greater than 60, hemoglobin 12.3, INR 2.6.   DISCHARGE DIAGNOSES:  1. Acute on chronic congestive heart failure with atrial fibrillation improved. 2. Chronic obstructive pulmonary disease, acute on chronic respiratory failure with acute bronchitis syndrome, improving.  3. Chronic left pleural effusion. 4. Diabetes mellitus, insulin-dependent. 5. Urinary tract infection.  6. Hyponatremia.  7. Hypothyroid, all stabilized.   Overall prognosis remains guarded.   ____________________________ Dianah Field Mable Fill, MD dcc:ap D: 06/15/2011 08:44:01 ET T: 06/15/2011 09:19:56 ET JOB#: [id number]  cc: Elexius Minar C. Mable Fill, MD, <Dictator> Tawni Millers MD ELECTRONICALLY SIGNED 06/18/2011 9:36

## 2014-08-18 NOTE — Discharge Summary (Signed)
PATIENT NAME:  [name], [name] MR#:  [id number] DATE OF BIRTH:  December 09, 1931  DATE OF ADMISSION:  06/11/2011 DATE OF DISCHARGE:  06/16/2011   Ms. Zahradnik continued to make some slow progress, still had some residual cough related to her bronchitis syndrome and pleural effusion.   VITAL SIGNS: Temperature 97.6, pulse 65, respirations 18, blood pressure 154/67. Fasting sugar 118.   ADDITIONAL LABORATORY STUDIES: Hemoglobin 14.1 and INR 2.8.   MEDICATIONS AT DISCHARGE:  1. Tylenol 325 to 650 mg for temperature greater than 100.4 or mild pain.  2. Norco 5/325 1 to 2 every four hours p.r.n. for moderate pain. 3. Protonix 40 mg daily for reflux syndrome.  4. Digoxin 0.25 mg for atrial fibrillation.  5. Glipizide 5 mg twice a day before meals for diabetes. 6. Betapace AF 160 mg b.i.d. for her blood pressure and atrial fibrillation. 7. Cardizem CD 180 mg daily for atrial fibrillation and hypertension.  8. Cozaar 25 mg daily for hypertension. 9. Tamiflu 7.5 mg b.i.d. through February 20th, which is today. She will need no more medication after this evening's dose.  10. Spiriva one capsule nightly.  11. Warfarin 6 mg on Sunday, Tuesday, Thursday, Saturday; 5 mg on Monday, Wednesday, and Friday.  12. Potassium 20 mEq b.i.d.  13. Tussionex cough suppressant 5 mg every 12 hours for severe cough.  14. Robitussin-DM every four hours for intermittent use for cough suppressant. 15. Synthroid 0.1 mg for hypothyroid replacement.  16. She is finishing her Rocephin used for urinary tract infection.  17. Levaquin 500 mg daily for an additional five days for treatment of her bronchitis syndrome.  18. Lasix 20 mg twice a day.  19. Metformin 500 mg twice a day.  20. Insulin sliding scale, 4 units for 201 to 300, 6 units for 301 to 400, 8 units for 401 to 500, 10 units for 501 to 600 units before meals and at bedtime. Hopefully this can be discontinued after her prednisone taper is completed. Today she gets the last  dose of her 30 mg; tomorrow she starts 20 mg daily for two days, then 10 mg daily for two days, and then stop.   21. Oxygen as needed for sat greater than 92%.. 22. DuoNeb 3 mL made up of albuterol and Ventolin every six hours while she is awake.   Though she was admitted with a diagnosis of possible pneumonia, this was not confirmed. This appeared to be more of a bronchitis with COPD with flare with residual known right pleural effusion and atelectasis.   DISCHARGE DIAGNOSES:  1. Acute on chronic congestive heart failure with atrial fibrillation. 2. Chronic obstructive pulmonary disease with acute respiratory decompensation secondary to acute bronchitis syndrome.  3. Chronic left pleural effusion. 4. Diabetes mellitus, insulin oral agent dependent. 5. Urinary tract infection.  6. Hyponatremia, improved. 7. Hypothyroidism, compensated.  8. Valvular cardiomyopathy, compensated.   PROGNOSIS: Overall prognosis remains guarded. She is going to rehab for further strengthening and balance improvement. She is highly motivated.  ____________________________ Dianah Field. Mable Fill, MD dcc:drc D: 06/16/2011 07:52:03 ET T: 06/16/2011 08:05:44 ET JOB#: [id number]  cc: Don C. Mable Fill, MD, <Dictator> Tawni Millers MD ELECTRONICALLY SIGNED 06/18/2011 9:36

## 2014-08-18 NOTE — H&P (Signed)
PATIENT NAME:  [name] Perkins, [name] Perkins MR#:  [id number] DATE OF BIRTH:  1931-05-21  DATE OF ADMISSION:  06/11/2011  REFERRING PHYSICIAN: Dr. Ulice Brilliant  PRIMARY CARE PHYSICIAN: Dr. Mable Fill PRIMARY CARDIOLOGIST: Dr. Saralyn Pilar  PRIMARY PULMONOLOGIST: Dr. Raul Del  PRESENTING COMPLAINT: Increasing shortness of breath, wheezing, cough and weakness with poor ambulatory effort.   HISTORY OF PRESENT ILLNESS: [name] Perkins is a pleasant 81 year old woman with history of valvular heart disease status post mitral valve replacement, paroxysmal atrial fibrillation and sick sinus syndrome status post pacemaker placement, cardiomyopathy with ejection fraction of 45%, hypothyroidism, diabetes, chronic obstructive pulmonary disease on 2 liters of oxygen presenting with her husband with complaints of productive yellowish sputum, cough with increased shortness of breath and wheezing and worsening weakness to the point where for the past three days she is not a been able to ambulate. According to her husband approximately two weeks ago her symptoms began but three days ago has escalated. He himself dealt with the flu three weeks ago which has now resolved. She reports today feeling diaphoretic but no chest pain. No hemoptysis. She reports some chest congestion but no drainage or nasal congestion. No palpitations, presyncope, or syncope. Reports that she had six months ago development of cough with fluid build-up and after diuresis her cough has improved and she had significant swelling at this time but has diuresed with improvement in her swelling but for again past couple weeks she has had recurrence of the lower extremity edema.   PAST MEDICAL HISTORY:  1. Paroxysmal atrial fibrillation and sick sinus syndrome status post pacemaker placement in May 2007.  2. Cardiomyopathy with ejection fraction of 45%.  3. Hypothyroidism.  4. Hypertension.  5. Valvular heart disease status post mitral valve replacement with St. Jude valve.   6. Diabetes with retinopathy.  7. Vertigo.  8. Colonic polyps.  9. Chronic obstructive pulmonary disease on chronic oxygen of 2 liters.   PAST SURGICAL HISTORY:  1. Mitral valve replacement as above.  2. Pacemaker placement.  3. Tonsillectomy.  4. Left fracture arm repair.  5. Bladder tumor surgery.   ALLERGIES: Penicillin and sulfa.   MEDICATIONS:  1. Cardizem CD 180 mg daily.  2. Losartan 25 mg daily.  3. KCl 10 mEq daily.  4. Lasix 20 mg b.i.d.  5. Digoxin 250 mcg daily.  6. Glipizide 5 mg b.i.d.  7. Synthroid 100 mcg daily.  8. Metformin 1000 mg b.i.d.  9. Sotalol 160 mg b.i.d.  10. Coumadin 5 mg on Monday, Wednesdays, Fridays and Sundays and 6 mg on Tuesday, Thursday and Saturday.   SOCIAL HISTORY: She lives in [place] with her husband. She denies any tobacco use but had secondhand smoking with her husband. No alcohol use.   FAMILY HISTORY: Diabetes and heart disease.    REVIEW OF SYSTEMS: CONSTITUTIONAL: Endorses chills. EYES: History of diabetic retinopathy. ENT: No hearing changes or epistaxis or discharge. CARDIOVASCULAR: No chest pain. She has orthopnea and denies PND. Has worsening lower extremity edema. No presyncope or syncope. RESPIRATORY: Per history of present illness. GASTROINTESTINAL: No nausea, vomiting, diarrhea, abdominal pain or melena. GENITOURINARY: No dysuria or hematuria. ENDO: No polyuria or polydipsia. HEMATOLOGIC: No easy bleeding. She has easy bruising. SKIN: No ulcers. MUSCULOSKELETAL: Denies any pain currently but she does have occasional joint pain. NEUROLOGIC: No history of strokes or seizures. PSYCH: Denies any suicidal ideation.   PHYSICAL EXAMINATION:  VITAL SIGNS: Temperature 97.9, pulse 74, respiratory rate 20, blood pressure 175/73, blood pressure during evaluation up to 204/86, sating initially  92% on 2 liters but current evaluation shows 87% on 2 liters.   GENERAL: Lying in bed, weak appearing, in no apparent distress.   HEENT:  Normocephalic, atraumatic. Pupils equal, symmetric. Nasal cannula in place. She has moist mucous membranes.   NECK: Soft and supple. No adenopathy. She has elevated JVP of approximately 8 cm.   CARDIOVASCULAR: Non-tachy. She has a systolic murmur. No rubs or gallops.   LUNGS: Coarse breath sounds diffusely, more prominent in the bases. No use of accessory muscles or increased respiratory effort.   ABDOMEN: Soft. Positive bowel sounds. No mass appreciated.   EXTREMITIES: 2+ pitting edema bilaterally. Dorsal pedis pulses intact.   MUSCULOSKELETAL: No joint effusion.   SKIN: No ulcers.   NEUROLOGIC: Symmetrical strength of upper and lower extremities. No focal deficits appreciated.   LABORATORY, DIAGNOSTIC AND RADIOLOGICAL DATA: Influenza A and B negative. Urinalysis with specific gravity of 1.018, blood 2+, pH 5, protein 100 mg/dL, leukocyte esterase 1+, RBC 53 per high-power field, WBC 31 per high-power field, 3+ bacteria. Chest PA and lateral with increased density at the right lung base suspicious for pneumonia versus atelectasis. Minimal patchy density stable in the paramediastinal right lung apex. There is stable cardiomegaly. Pulmonary vascular congestion and pacemaker is present. Troponin less than 0.02. WBC 18, hemoglobin 12.6, hematocrit 37.8, platelets 198, MCV 92, glucose 286, BUN 15, creatinine 0.56, sodium 125, potassium 5.5, chloride 89, carbon dioxide 24, calcium 8.5, total bilirubin 1.3, alkaline phosphatase 152, ALT 23, AST 47, total protein 7.3, albumin 3.7. BNP 5090. CK 129, MB 1.0. EKG with right bundle branch block, rate of 64. No ST changes.   ASSESSMENT AND PLAN: [name] Perkins is a 81 year old woman with history of atrial fibrillation, sick sinus syndrome status post pacemaker placement, cardiomyopathy, hypothyroidism, valvular heart disease, chronic obstructive pulmonary disease on chronic oxygen, diabetes presenting with cough, shortness of breath, wheezing and weakness with  ambulatory dysfunction.  1. Acute on chronic respiratory failure in the setting of systolic dysfunction congestive heart failure plus/minus pneumonia. Will initiate patient on Levaquin, SVNs, increase oxygen supplementation. Start on Solu-Medrol and Spiriva. Given her close contact with her husband who was treated for influenza despite a negative influenza given concerns for her baseline medical issues will go ahead and start her on Tamiflu as well. Will send sputum culture. Her blood culture has been stent. Repeat her chest x-ray in the morning after diuresis to re-evaluate possible basilar infiltrate. Will obtain a dig level. Restart her digoxin. Start patient on nitroglycerin paste, Lasix. Follow ins and outs and daily weights. Continue on tele and cycle cardiac enzymes.  2. Malignant hypertension. As above nitro paste. Will restart her diltiazem, losartan.  3. Hyponatremia with slight worsening from baseline likely in the setting of congestive heart failure and pneumonia. Continue to follow with diuresis.  4. Hyperkalemia. Will hold her potassium supplementation for now, may need to restart with increase in her Lasix regimen.  5. Urinary tract infection. As above, with Levaquin. Send urine culture.  6. Paroxysmal atrial fibrillation/sick sinus syndrome status post pacemaker placement. Restart Coumadin, sotalol and diltiazem. Send a PT/INR.  7. Hypothyroidism. Send TSH and restart Synthroid.  8. Diabetes. Hold her metformin. Restart her glipizide.  9. Prophylaxis. On Coumadin and Protonix.   TIME SPENT: Approximately 50 minutes spent on patient care.   ____________________________ Rita Ohara, MD ap:cms D: 06/11/2011 23:07:29 ET T: 06/12/2011 08:22:52 ET JOB#: [id number]  cc: Brien Few Akaila Rambo, MD, <Dictator> Don C. Mable Fill, MD Rita Ohara MD ELECTRONICALLY SIGNED  06/22/2011 0:46 

## 2014-08-18 NOTE — Consult Note (Signed)
General Aspect patient is a 81 year old female with history of mitral valve disease status post mitral valve replacement with a [employer] Jude bicuspid valve in 1992, history of mild to moderate aortic stenosis with a calculated aortic valve area of 0.9 cm?? with a mean gradient of 10 mm Hg and moderate to severe tricuspid regurgitation.  Her ejection fraction is 45%.  She also has a history of paroxysmal atrial fibrillation as well as sick sinus syndrome status post placement of a permanent pacemaker in 2007.  She has oxygen-dependent COPD.  She was admitted with increasing shortness of breath.  She has improved with bronchodilators and gentle diuresis.  She has been on and aggressive medical regimen including Cardizem CD 180 mg twice daily, digoxin 250 mcg daily, sotalol 160 mg twice daily.  She is also chronically anticoagulated with warfarin for her prosthetic mitral valve.   Physical Exam:   GEN WD, thin    HEENT PERRL    NECK supple    RESP rhonchi    CARD Regular rate and rhythm  Murmur    Murmur Systolic    Systolic Murmur Out flow  axilla    ABD denies tenderness  normal BS    LYMPH negative neck    EXTR negative cyanosis/clubbing    SKIN normal to palpation    NEURO cranial nerves intact, motor/sensory function intact    PSYCH A+O to time, place, person   Review of Systems:   Subjective/Chief Complaint shortness of breath    General: No Complaints    Skin: No Complaints    ENT: No Complaints    Eyes: No Complaints    Neck: No Complaints    Respiratory: Short of breath    Cardiovascular: Dyspnea    Gastrointestinal: No Complaints    Genitourinary: No Complaints    Vascular: No Complaints    Musculoskeletal: No Complaints    Neurologic: No Complaints    Hematologic: No Complaints    Endocrine: No Complaints    Psychiatric: No Complaints    Review of Systems: All other systems were reviewed and found to be negative    Medications/Allergies  Reviewed Medications/Allergies reviewed     Congestive Heart Failure: CHF   rapid heart rate:    Hypothyroidism:    Other, see comments: mechanical value   Diabetes Mellitus, Type II (NIDD):    mitral valve replaced with a mechanical valve:   Home Medications: Medication Instructions Status  sotalol 160 mg oral tablet 1  orally 2 times a day  Active  Levoxyl 100 mcg (0.1 mg) oral tablet 1  orally once a day  Active  Cardizem CD 180 mg/24 hours oral capsule, extended release 1  orally 2 times a day  Active  digoxin 250 mcg (0.25 mg) oral tablet 1  orally once a day  Active  glipiZIDE 2.5 mg oral tablet, extended release 1  orally once a day  Active  metformin 500 mg oral tablet 1  orally 2 times a day  Active  furosemide 20 mg oral tablet 1  orally 2 times a day  Active  losartan 25 mg oral tablet 1 tab(s) orally once a day Active  warfarin 6 mg oral tablet 1 tab(s) orally Tuesday, Thursday, Saturday Active  potassium chloride 10 mEq oral capsule, extended release 1 cap(s) orally 2 times a day Active  warfarin 5 mg oral tablet 1 tab(s) orally Monday, Wednesday, Friday and Sunday Active   EKG:   EKG NSR  x ray dye - - 'makes me feel sick': Other  Erythromycin: Unknown  Parafon Forte DSC: Unknown  Penicillin: Unknown  Septra: Unknown    Impression 81 year old female with history of valvular heart disease including mitral valve replacement with a [employer]. Jude mechanical valve in 1992 in the mitral position, history of paroxysmal atrial fibrillation, history of mild cardiomyopathy with an ejection fraction of 45%, history of hypertension, hypothyroidism status post pacemaker implant in 2007, history of moderate aortic stenosis with an aortic valve area 0.9 cm?? is well as severe and TR who is admitted with progressive shortness of breath.  Patient also has oxygen-dependent COPD.  Chest x-ray revealed right pleural effusion.  CT scan also revealed right pleural effusion.  There was  probable mild pulmonary edema as well.  She is improved with bronchodilators, and gentle diuresis.  Her heart rate is well controlled with sotalol, Cardizem and digoxin.  She is also chronically anticoagulated with warfarin for her prosthetic valve.    Plan 1.  Continue with current medications including Cardizem CD 180 mg twice daily, digoxin 250 mcg daily, sotalol 160 mg twice daily, warfarin,, and losartan. 2.  Low-sodium diet 3.  Careful diuresis following renal function 4.  Close followup of INR with a target INR of 2.5-3.5 for mitral valve prosthesis 5.  Ambulate as tolerated   Electronic Signatures: Teodoro Spray (MD)  (Signed 18-Feb-13 16:55)  Authored: General Aspect/Present Illness, History and Physical Exam, Review of System, Past Medical History, Home Medications, EKG , Allergies, Impression/Plan   Last Updated: 18-Feb-13 16:55 by Teodoro Spray (MD)

## 2014-08-20 ENCOUNTER — Encounter: Payer: Self-pay | Admitting: *Deleted

## 2014-08-20 NOTE — Patient Outreach (Signed)
Documentation.      [name] Perkins.   [facility]  [phone]

## 2014-08-24 ENCOUNTER — Emergency Department: Admit: 2014-08-24 | Disposition: E | Payer: Self-pay | Admitting: Internal Medicine

## 2014-08-24 LAB — COMPREHENSIVE METABOLIC PANEL
ALBUMIN: 3.7 g/dL
ALK PHOS: 114 U/L
ALT: 12 U/L — AB
AST: 35 U/L
Anion Gap: 9 (ref 7–16)
BUN: 68 mg/dL — ABNORMAL HIGH
Bilirubin,Total: 0.6 mg/dL
Calcium, Total: 8.2 mg/dL — ABNORMAL LOW
Chloride, Urine Random: 98 mmol/L — ABNORMAL LOW
Co2: 27 mmol/L
Creatinine: 3.35 mg/dL — ABNORMAL HIGH
EGFR (African American): 14 — ABNORMAL LOW
EGFR (Non-African Amer.): 12 — ABNORMAL LOW
GLUCOSE, CSF: 118 mg/dL — AB
Potassium, Urine Random: 6.7 mmol/L
Sodium, Urine Random: 134 mmol/L — ABNORMAL LOW
TOTAL PROTEIN: 6.8 g/dL

## 2014-08-24 LAB — CBC
HCT: 33.4 % — ABNORMAL LOW (ref 35.0–47.0)
HGB: 10.3 g/dL — ABNORMAL LOW (ref 12.0–16.0)
MCH: 27 pg (ref 26.0–34.0)
MCHC: 30.9 g/dL — AB (ref 32.0–36.0)
MCV: 88 fL (ref 80–100)
PLATELETS: 160 10*3/uL (ref 150–440)
RBC: 3.81 10*6/uL (ref 3.80–5.20)
RDW: 16.9 % — ABNORMAL HIGH (ref 11.5–14.5)
WBC: 7.9 10*3/uL (ref 3.6–11.0)

## 2014-08-24 LAB — PROTIME-INR
INR: 12.3 — AB
PROTHROMBIN TIME: 92.5 s — AB

## 2014-08-24 LAB — TROPONIN I: TROPONIN-I: 0.03 ng/mL

## 2014-08-25 DEATH — deceased

## 2014-08-26 ENCOUNTER — Other Ambulatory Visit: Payer: Self-pay | Admitting: *Deleted

## 2014-08-26 NOTE — Patient Outreach (Signed)
Telephone call (f/u on spouse who called nurse line 4/30,  Pt was seen in ED at  [facility] 4/30).     RN CM spoke with spouse and was informed that pt was taken to ED, passed away.      Zara Chess.   [facility]  [phone]

## 2014-11-05 NOTE — Patient Outreach (Signed)
[name] Perkins) Care Management  08/26/2014  [name] Perkins 1931/11/01 [id number]   Per [name]'s note passed away.  [name] Perkins. [place], [place] [facility] [place] Assistant Phone: [phone] Fax: [phone]

## 2015-08-28 IMAGING — CR DG CHEST 1V
1 series · 1 of 1 positions shown · non-contrast
Comparison: Chest x-ray 12/27/2013, 12/25/2013

CLINICAL DATA: 82-year-old female with a history of
unresponsiveness. Known COPD.

EXAM:
CHEST  1 VIEW

[ap]
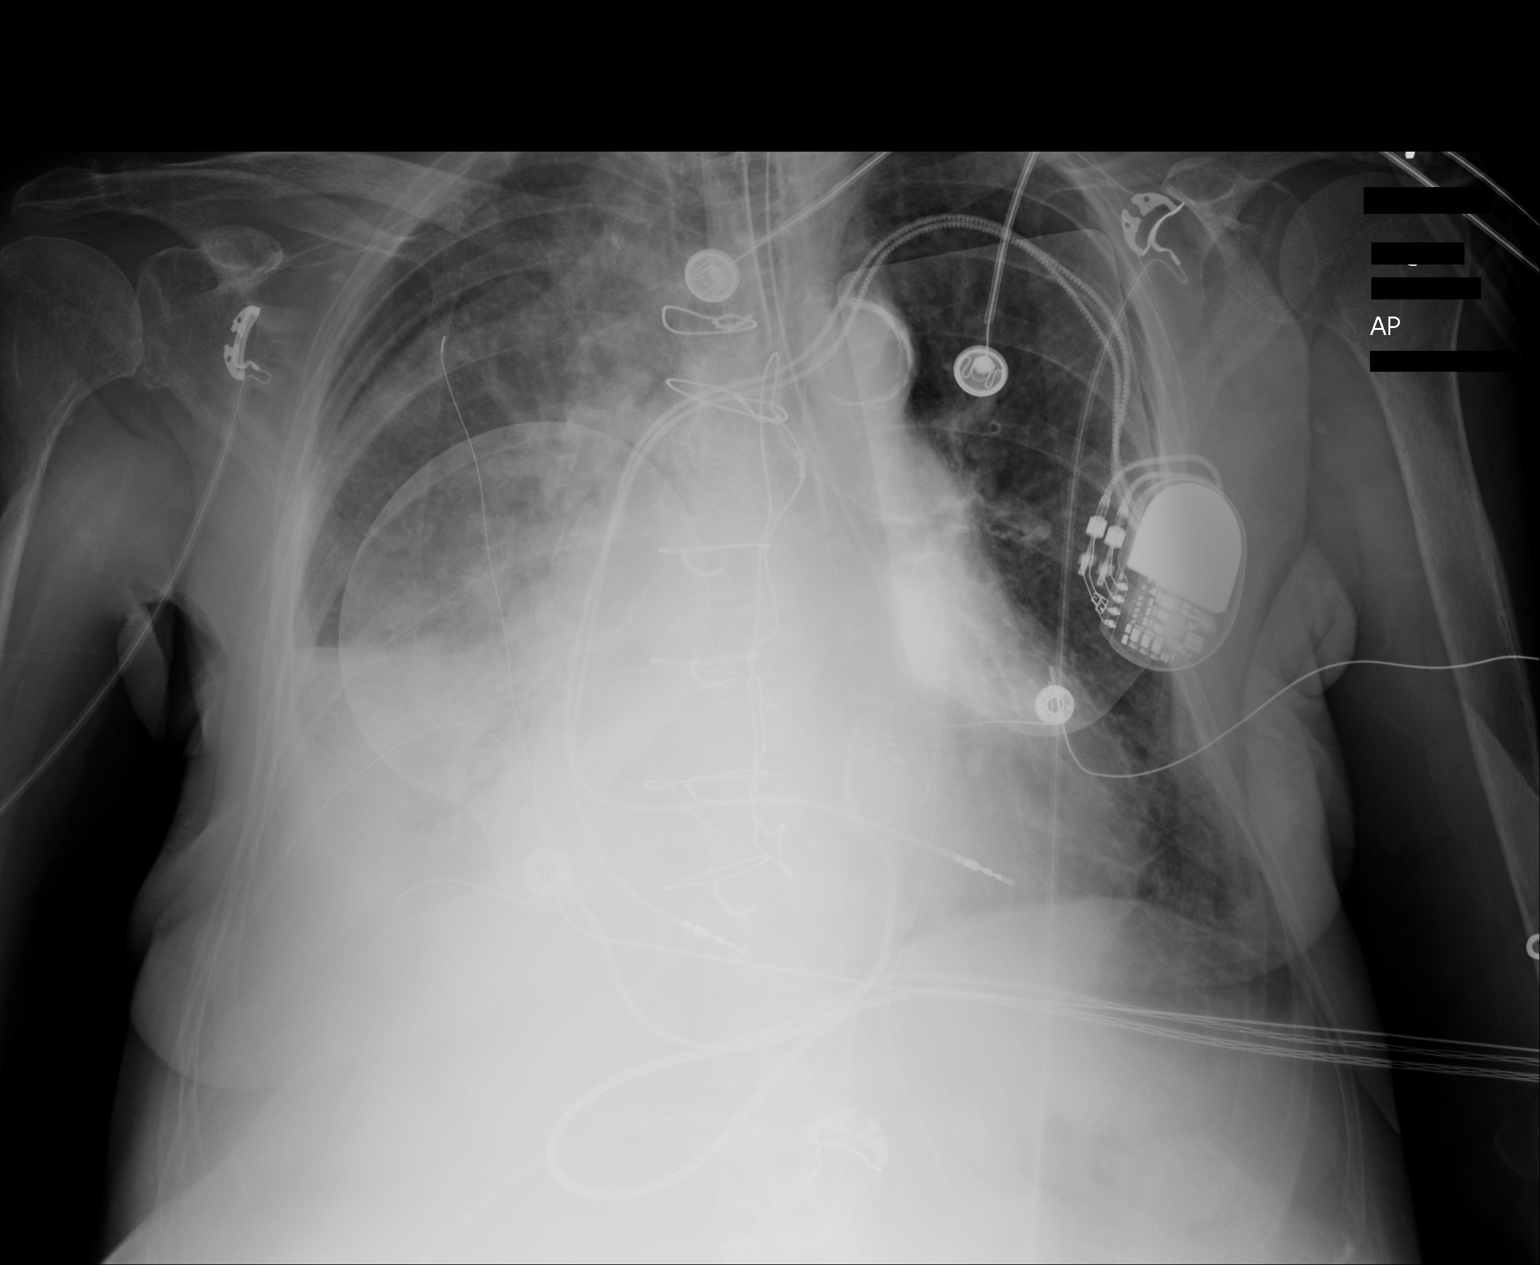

[1 of 1 positions shown; findings below may reference images not displayed]

FINDINGS: Cardiomediastinal silhouette likely unchanged though partially
obscured by overlying lung/ pleural disease.

Dense airspace opacity of the right, worsening from the comparison
plain film. Obscuration right hemidiaphragm and right heart border.

Left lung relatively well aerated.

No pneumothorax.

Endotracheal tube terminates 3.5 cm above the carina.

Gastric tube projects over the mediastinum, to the left of the spine
with the side port at the gastroesophageal junction. Tip terminates
in the left upper quadrant.

Unchanged cardiac pacing device with right atrial and light
ventricular leads.

Surgical changes of prior median sternotomy.  Atherosclerosis.

Overlying EKG leads
IMPRESSION: Worsening airspace opacity of the right, concerning for pneumonia.
There is likely associated atelectasis and effusion.

Endotracheal tube has been placed, terminating suitably above the
carina.

Gastric tube has been placed in the interval, with the side port at
the gastroesophageal junction.

Defibrillator pads in place.
